# Patient Record
Sex: Male | Born: 1995 | Race: Black or African American | Hispanic: No | Marital: Single | Smoking: Never smoker
Health system: Southern US, Community
[De-identification: ages and names within clinical notes are randomized; demographics above are authoritative.]

## PROBLEM LIST (undated history)

## (undated) DIAGNOSIS — F419 Anxiety disorder, unspecified: Secondary | ICD-10-CM

## (undated) DIAGNOSIS — T7840XA Allergy, unspecified, initial encounter: Secondary | ICD-10-CM

## (undated) DIAGNOSIS — F32A Depression, unspecified: Secondary | ICD-10-CM

## (undated) DIAGNOSIS — J45909 Unspecified asthma, uncomplicated: Secondary | ICD-10-CM

## (undated) HISTORY — DX: Allergy, unspecified, initial encounter: T78.40XA

## (undated) HISTORY — DX: Depression, unspecified: F32.A

## (undated) HISTORY — DX: Unspecified asthma, uncomplicated: J45.909

## (undated) HISTORY — DX: Anxiety disorder, unspecified: F41.9

---
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
## 2014-10-22 ENCOUNTER — Encounter (INDEPENDENT_AMBULATORY_CARE_PROVIDER_SITE_OTHER): Payer: Self-pay

## 2014-10-22 ENCOUNTER — Ambulatory Visit (INDEPENDENT_AMBULATORY_CARE_PROVIDER_SITE_OTHER)
Admission: RE | Admit: 2014-10-22 | Discharge: 2014-10-22 | Disposition: A | Discharge status: Home / Self Care | Payer: BLUE CROSS/BLUE SHIELD | Source: Ambulatory Visit | Attending: Internal Medicine | Admitting: Internal Medicine

## 2014-10-22 ENCOUNTER — Ambulatory Visit (INDEPENDENT_AMBULATORY_CARE_PROVIDER_SITE_OTHER): Payer: BLUE CROSS/BLUE SHIELD | Admitting: Internal Medicine

## 2014-10-22 ENCOUNTER — Other Ambulatory Visit: Payer: BLUE CROSS/BLUE SHIELD

## 2014-10-22 ENCOUNTER — Encounter: Payer: Self-pay | Admitting: Internal Medicine

## 2014-10-22 VITALS — BP 128/78 | HR 95 | Ht 69.0 in | Wt 154.0 lb

## 2014-10-22 DIAGNOSIS — R0782 Intercostal pain: Secondary | ICD-10-CM | POA: Insufficient documentation

## 2014-10-22 DIAGNOSIS — R079 Chest pain, unspecified: Secondary | ICD-10-CM

## 2014-10-22 NOTE — Progress Notes (Signed)
Subjective:    Patient ID: Frederick Armstrong, male    DOB: Jun 02, 1996, 19 y.o.   MRN: [id number]  PCP No primary care provider on file.   HPI  IOV 10/22/2014  Chief Complaint  Patient presents with  . Pulmonary Consult    Pt c/o left chest pain randomly, last for 10 minutes. Pt stated when he inhales the pain increase. Pt denies SOB and cough.    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 Brought in by mom. He reports a history of chest pain in the left precordial area to the right of the left nipple present since October/November 2015. Pain developed during the off season when he was not exercising or training. It is persisted since then. Pain occurs few to several times a day. Happens randomly. Each episode lasts not longer than 10 minutes. It is very localized and focal. It feels like something is ballooning up there and is ready to pop. Deep inspiration makes the pain worse but other than this there is no aggravating or relieving factors. Is no associated radiation. At times the pain can be severe that he feels like he is dying. This no associated shortness of breath cough, wheezing, hemoptysis, night sweats, chills. He denies any local trauma.  Discussed substance abuse history with the patient he absolutely and categorically denies smoking cigarettes, marijuana, heroin, cocaine. Denies any unprotected sexual high risk behavior     has a past medical history of Childhood asthma.   reports that he has never smoked. He has never used smokeless tobacco.  No past surgical history on file.  No Known Allergies   There is no immunization history on file for this patient.  Family History  Problem Relation Age of Onset  . Heart disease Neg Hx   . Hyperlipidemia Neg Hx   . Cervical cancer Maternal Grandmother   . Hypertension Maternal Grandmother   . Hypertension Maternal Grandfather     No current outpatient prescriptions on file.     Review  of Systems  Constitutional: Negative for fever and unexpected weight change.  HENT: Negative for congestion, dental problem, ear pain, nosebleeds, postnasal drip, rhinorrhea, sinus pressure, sneezing, sore throat and trouble swallowing.   Eyes: Negative for redness and itching.  Respiratory: Negative for cough, chest tightness, shortness of breath and wheezing.   Cardiovascular: Positive for chest pain and leg swelling. Negative for palpitations.  Gastrointestinal: Negative for nausea and vomiting.  Genitourinary: Negative for dysuria.  Musculoskeletal: Negative for joint swelling.  Skin: Negative for rash.  Neurological: Negative for headaches.  Hematological: Does not bruise/bleed easily.  Psychiatric/Behavioral: Negative for dysphoric mood. The patient is not nervous/anxious.        Objective:   Physical Exam  Constitutional: He is oriented to person, place, and time. He appears well-developed and well-nourished. No distress.  HENT:  Head: Normocephalic and atraumatic.  Right Ear: External ear normal.  Left Ear: External ear normal.  Mouth/Throat: Oropharynx is clear and moist. No oropharyngeal exudate.  Eyes: Conjunctivae and EOM are normal. Pupils are equal, round, and reactive to light. Right eye exhibits no discharge. Left eye exhibits no discharge. No scleral icterus.  Neck: Normal range of motion. Neck supple. No JVD present. No tracheal deviation present. No thyromegaly present.  Cardiovascular: Normal rate, regular rhythm and intact distal pulses.  Exam reveals no gallop and no friction rub.   No murmur heard. Pulmonary/Chest: Effort normal and breath sounds normal. No respiratory distress. He has  no wheezes. He has no rales. He exhibits no tenderness.  No tenderness  Abdominal: Soft. Bowel sounds are normal. He exhibits no distension and no mass. There is no tenderness. There is no rebound and no guarding.  Musculoskeletal: Normal range of motion. He exhibits no edema or  tenderness.  Extremely muscular Chiseled body Hardly any body fat  Lymphadenopathy:    He has no cervical adenopathy.  Neurological: He is alert and oriented to person, place, and time. He has normal reflexes. No cranial nerve deficit. Coordination normal.  Skin: Skin is warm and dry. No rash noted. He is not diaphoretic. No erythema. No pallor.  Psychiatric: He has a normal mood and affect. His behavior is normal. Judgment and thought content normal.  Nursing note and vitals reviewed.   Filed Vitals:   10/22/14 1502  BP: 128/78  Pulse: 95  Height: 5\' 9"  (1.753 m)  Weight: 154 lb (69.854 kg)  SpO2: 100%         Assessment & Plan:     ICD-9-CM ICD-10-CM   1. Chest pain, unspecified chest pain type 786.50 R07.9 D-Dimer, Quantitative     DG Chest 2 View   I think he has musculoskeletal chest pain and may be some costochondritis with potential tendency to dislocate his left costochondral junction areas in the left lower ribs. Beyond this am not able to produce any other etiology. Given extreme concern by his mom and himself, we'll check d-dimer and chest x-ray. Based on this will consider pulmonary function tests, EKG, CT chest and follow-up   They both are agreeable to the plan   Dr. Kalman ShanMurali Suzanna Zahn, M.D., [facility] Pulmonary and Critical Care Medicine Staff Physician [facility] [place] [facility] Pager: [phone], If no answer or between  15:00h - 7:00h: call [phone]  10/22/2014 3:51 PM

## 2014-10-22 NOTE — Patient Instructions (Addendum)
ICD-9-CM ICD-10-CM   1. Chest pain, unspecified chest pain type 786.50 R07.9     - not sure what is causing your pain - suspect some friction between rib and rib joint - called costochondral junction - best rule out some serious diseases  Plan  - do d-dimer blood test - do CXR 2 view - depending on above results will recommend other tests like EKG, CT or PFT and folllowup plan

## 2014-10-23 ENCOUNTER — Telehealth: Payer: Self-pay | Admitting: Internal Medicine

## 2014-10-23 DIAGNOSIS — R7989 Other specified abnormal findings of blood chemistry: Secondary | ICD-10-CM

## 2014-10-23 LAB — D-DIMER, QUANTITATIVE: D-Dimer, Quant: 1.28 ug/mL-FEU — ABNORMAL HIGH (ref 0.00–0.48)

## 2014-10-23 NOTE — Telephone Encounter (Signed)
lmtcb for pt.  

## 2014-10-23 NOTE — Telephone Encounter (Signed)
LEt Donne AnonGahrul Mccuiston know that cxr is normal but d-dimer is high. Means there is a possibility for blood clost  NExt step  CT angio chest  - rule out PE - please order asap  Thanks  Dr. Kalman ShanMurali Tyse Auriemma, M.D., [facility] Pulmonary and Critical Care Medicine Staff Physician [facility] [place] [facility] Pager: [phone], If no answer or between  15:00h - 7:00h: call [phone]  10/23/2014 6:31 AM

## 2014-10-23 NOTE — Telephone Encounter (Signed)
lmtcb for pt and EC, [name].

## 2014-10-23 NOTE — Progress Notes (Signed)
Quick Note:  lmtcb for pt x 2. Will sign off as results are within a phone note being followed. ______

## 2014-10-24 ENCOUNTER — Ambulatory Visit (INDEPENDENT_AMBULATORY_CARE_PROVIDER_SITE_OTHER)
Admission: RE | Admit: 2014-10-24 | Discharge: 2014-10-24 | Disposition: A | Discharge status: Home / Self Care | Payer: BLUE CROSS/BLUE SHIELD | Source: Ambulatory Visit | Attending: Internal Medicine | Admitting: Internal Medicine

## 2014-10-24 DIAGNOSIS — R791 Abnormal coagulation profile: Secondary | ICD-10-CM

## 2014-10-24 DIAGNOSIS — R7989 Other specified abnormal findings of blood chemistry: Secondary | ICD-10-CM

## 2014-10-24 MED ORDER — IOHEXOL 350 MG/ML SOLN
80.0000 mL | Freq: Once | INTRAVENOUS | Status: AC | PRN
  Administered 2014-10-24: 80 mL via INTRAVENOUS

## 2014-10-24 NOTE — Telephone Encounter (Signed)
Pt aware of results. Aware order has been placed for CT angio chest for elevated d dimer and that our PCC's will call him shortly with date,time, and location for test. Nothing more needed at this time.

## 2014-10-24 NOTE — Telephone Encounter (Signed)
Called and spoke to pt. Informed pt of the CTA time and date. Pt verbalized understanding and denied any further questions or concerns at this time.    Note   [facility] at 10:15 arrival time. NPO 2 hours before. Checking on pre cert. Rhonda J Cobb LMOAM for pt to return my call ASAP. Rhonda J Cobb

## 2014-10-24 NOTE — Telephone Encounter (Signed)
Pt returning a call [phone]

## 2014-10-25 ENCOUNTER — Telehealth: Payer: Self-pay | Admitting: Internal Medicine

## 2014-10-25 DIAGNOSIS — R0782 Intercostal pain: Secondary | ICD-10-CM

## 2014-10-25 DIAGNOSIS — R079 Chest pain, unspecified: Secondary | ICD-10-CM

## 2014-10-25 NOTE — Telephone Encounter (Signed)
Spoke with pt mother, [name], aware of results.  Order placed for PFT Scheduled OV per MR with TP 11/01/14 at 2:30 - requested that results be sent with patient appt  [facility] ( arrive 15 mins early) No caffeine, no inhalers and no smoking 4 hours prior.   Nothing further needed.

## 2014-10-25 NOTE — Telephone Encounter (Signed)
Let Donne AnonGahrul Bottger know that CT chest is normal. Only thing is arm pit glads are a bit big and likely normal but needs to talk to PCP about it  Cause for pain is not known   - have him do ful PFT and return for followup with Tammy Parrett/me  - also note for myself/Tammy when he comes for fu: we will do urine tox screen (this is not an instruction to patient today)  Dr. Kalman ShanMurali Robey Massmann, M.D., [facility] Pulmonary and Critical Care Medicine Staff Physician [facility] [place] [facility] Pager: [phone], If no answer or between  15:00h - 7:00h: call [phone]  10/25/2014 10:14 AM

## 2014-11-01 ENCOUNTER — Ambulatory Visit (HOSPITAL_COMMUNITY)
Admission: RE | Admit: 2014-11-01 | Discharge: 2014-11-01 | Disposition: A | Discharge status: Home / Self Care | Payer: BLUE CROSS/BLUE SHIELD | Source: Ambulatory Visit | Attending: Internal Medicine | Admitting: Internal Medicine

## 2014-11-01 ENCOUNTER — Ambulatory Visit (INDEPENDENT_AMBULATORY_CARE_PROVIDER_SITE_OTHER): Payer: BLUE CROSS/BLUE SHIELD | Admitting: Adult Health

## 2014-11-01 ENCOUNTER — Encounter: Payer: Self-pay | Admitting: Adult Health

## 2014-11-01 VITALS — BP 124/66 | HR 79 | Temp 97.3°F | Ht 69.0 in | Wt 154.6 lb

## 2014-11-01 DIAGNOSIS — R079 Chest pain, unspecified: Secondary | ICD-10-CM

## 2014-11-01 DIAGNOSIS — R0782 Intercostal pain: Secondary | ICD-10-CM | POA: Diagnosis not present

## 2014-11-01 LAB — PULMONARY FUNCTION TEST
FEF 25-75 POST: 3.45 L/s
FEF 25-75 PRE: 4.59 L/s
FEF2575-%Change-Post: -24 %
FEF2575-%PRED-PRE: 104 %
FEF2575-%Pred-Post: 78 %
FEV1-%Change-Post: -8 %
FEV1-%PRED-PRE: 109 %
FEV1-%Pred-Post: 100 %
FEV1-PRE: 4.13 L
FEV1-Post: 3.78 L
FEV1FVC-%Change-Post: 0 %
FEV1FVC-%Pred-Pre: 97 %
FEV6-%CHANGE-POST: -8 %
FEV6-%PRED-POST: 102 %
FEV6-%Pred-Pre: 112 %
FEV6-POST: 4.5 L
FEV6-PRE: 4.91 L
FEV6FVC-%PRED-POST: 101 %
FEV6FVC-%PRED-PRE: 101 %
FVC-%Change-Post: -8 %
FVC-%PRED-POST: 102 %
FVC-%PRED-PRE: 111 %
FVC-POST: 4.5 L
FVC-Pre: 4.91 L
POST FEV1/FVC RATIO: 84 %
POST FEV6/FVC RATIO: 100 %
Pre FEV1/FVC ratio: 84 %
Pre FEV6/FVC Ratio: 100 %
RV % PRED: 120 %
RV: 1.56 L
TLC % pred: 96 %
TLC: 6.23 L

## 2014-11-01 MED ORDER — ALBUTEROL SULFATE (2.5 MG/3ML) 0.083% IN NEBU
2.5000 mg | INHALATION_SOLUTION | Freq: Once | RESPIRATORY_TRACT | Status: AC
  Administered 2014-11-01: 2.5 mg via RESPIRATORY_TRACT

## 2014-11-01 NOTE — Patient Instructions (Signed)
Your breathing test is normal. May try some Gas-X with meals. Prilosec daily as needed for heartburn Would refrain from heavy bench presses We will refer you to cardiology for evaluation of chest pain Follow up with pulmonary on as-needed basis

## 2014-11-01 NOTE — Progress Notes (Signed)
Subjective:    Patient ID: Frederick Armstrong, male    DOB: 18-Jul-1996, 19 y.o.   MRN: [id number]  PCP No primary care provider on file.   HPI  IOV 10/22/2014  Chief Complaint  Patient presents with  . Pulmonary Consult    Pt c/o left chest pain randomly, last for 10 minutes. Pt stated when he inhales the pain increase. Pt denies SOB and cough.    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 Brought in by mom. He reports a history of chest pain in the left precordial area to the right of the left nipple present since October/November 2015. Pain developed during the off season when he was not exercising or training. It is persisted since then. Pain occurs few to several times a day. Happens randomly. Each episode lasts not longer than 10 minutes. It is very localized and focal. It feels like something is ballooning up there and is ready to pop. Deep inspiration makes the pain worse but other than this there is no aggravating or relieving factors. Is no associated radiation. At times the pain can be severe that he feels like he is dying. This no associated shortness of breath cough, wheezing, hemoptysis, night sweats, chills. He denies any local trauma.  Discussed substance abuse history with the patient he absolutely and categorically denies smoking cigarettes, marijuana, heroin, cocaine. Denies any unprotected sexual high risk behavior  11/01/2014 Follow up  Patient returns for a two-week follow-up. Was seen for a pulmonary consultation. Last visit for chest pain that began in Oct 2015 .  Describes as pain along mid chest wall, some tenderness.  Worse with inspiration. Mainly at rest. 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  d-dimer was elevated, he subsequently underwent a CT chest that showed no evidence of pulmonary embolism. Lungs were clear.  PFTs showed FEV1 at 109%, ratio 84, no significant bronchodilator response, FVC 111%, diffusing capacity 121% He  denies any exertional chest pain, palpitations, presyncope, syncope, dizziness, headaches, nausea, vomiting, heartburn. Negative family history for heart disease   Review of Systems  Constitutional:   No  weight loss, night sweats,  Fevers, chills, fatigue, or  lassitude.  HEENT:   No headaches,  Difficulty swallowing,  Tooth/dental problems, or  Sore throat,                No sneezing, itching, ear ache, nasal congestion, post nasal drip,   CV:  No  ,  Orthopnea, PND, swelling in lower extremities, anasarca, dizziness, palpitations, syncope.   GI  No heartburn, indigestion, abdominal pain, nausea, vomiting, diarrhea, change in bowel habits, loss of appetite, bloody stools.   Resp: No shortness of breath with exertion or at rest.  No excess mucus, no productive cough,  No non-productive cough,  No coughing up of blood.  No change in color of mucus.  No wheezing.  No chest wall deformity  Skin: no rash or lesions.  GU: no dysuria, change in color of urine, no urgency or frequency.  No flank pain, no hematuria   MS:  No joint pain or swelling.  No decreased range of motion.  No back pain.  Psych:  No change in mood or affect. No depression or anxiety.  No memory loss.         Objective:   Physical Exam . GEN: A/Ox3; pleasant , NAD, well nourished   HEENT:  [place]/AT,  EACs-clear, TMs-wnl, NOSE-clear, THROAT-clear, no lesions, no postnasal drip  or exudate noted.   NECK:  Supple w/ fair ROM; no JVD; normal carotid impulses w/o bruits; no thyromegaly or nodules palpated; no lymphadenopathy.  RESP  Clear  P & A; w/o, wheezes/ rales/ or rhonchi.no accessory muscle use, no dullness to percussion  CARD:  RRR, no m/r/g  , no peripheral edema, pulses intact, no cyanosis or clubbing.  GI:   Soft & nt; nml bowel sounds; no organomegaly or masses detected.  Musco: Warm bil, no deformities or joint swelling noted.   Neuro: alert, no focal deficits noted.    Skin: Warm, no lesions or  rashes      Assessment & Plan:

## 2014-11-01 NOTE — Assessment & Plan Note (Addendum)
4 month history of mid chest wall pain , questionable etiology Pulmonary Workup has been unrevealing CT chest was negative for pulmonary embolism and lungs were clear. PFT today shows no airflow obstruction or restriction. O2 saturation is 100%. Feel this is likely musculoskeletal in nature. 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

## 2014-11-21 ENCOUNTER — Encounter: Payer: Self-pay | Admitting: Cardiovascular Disease

## 2014-11-21 ENCOUNTER — Ambulatory Visit (INDEPENDENT_AMBULATORY_CARE_PROVIDER_SITE_OTHER): Payer: BLUE CROSS/BLUE SHIELD | Admitting: Cardiovascular Disease

## 2014-11-21 VITALS — BP 105/74 | HR 71 | Ht 69.0 in | Wt 151.0 lb

## 2014-11-21 DIAGNOSIS — R079 Chest pain, unspecified: Secondary | ICD-10-CM

## 2014-11-21 LAB — SEDIMENTATION RATE: SED RATE: 3 mm/h (ref 0–22)

## 2014-11-21 LAB — C-REACTIVE PROTEIN: CRP: <0.1 mg/dL — ABNORMAL LOW (ref 0.5–20.0)

## 2014-11-21 NOTE — Assessment & Plan Note (Signed)
Very atypical Really sounds like cartilage pain  Normal exam , no high risk family history .  Normal coronary origins on CT done for PE Pain is primarily non exertional  Will get echo to r/o HOCM or other structural heart disease.  Unfortunately left without ECG being done and has none in system so will have it when he gets his echo

## 2014-11-21 NOTE — Progress Notes (Signed)
Patient ID: Frederick Armstrong, male   DOB: [birth year], 19 y.o.   MRN: [id number]     Cardiology Office Note   Date:  11/21/2014   ID:  Frederick Armstrong, DOB 11-03-1995, MRN [id number]  PCP:  No PCP Per Patient  Cardiologist:   Charlton Haws, MD   No chief complaint on file.     History of Present Illness: Frederick Armstrong is a 19 y.o. male who presents for atypical chest pain.  .Was seen for a pulmonary consultation. Last visit for chest pain that began in Oct 2015 . Describes as pain along mid chest wall, some tenderness. Worse with inspiration. Mainly at rest. 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 d-dimer was elevated, he subsequently underwent a CT chest that showed no evidence of pulmonary embolism.Lungs were clear. PFTs showed FEV1 at 109%, ratio 84, no significant bronchodilator response, FVC 111%, diffusing capacity 121%He denies any exertional chest pain, palpitations, presyncope, syncope, dizziness, headaches, nausea, vomiting, heartburn.  Negative family history for heart disease   Review of CT 2.3.16 also showed normal aorta and normal origin of the coronary arteries    Past Medical History  Diagnosis Date  . Childhood asthma     No past surgical history on file.   No current outpatient prescriptions on file.   No current facility-administered medications for this visit.    Allergies:   Review of patient's allergies indicates no known allergies.    Social History:  The patient  reports that he has never smoked. He has never used smokeless tobacco. He reports that he does not drink alcohol or use illicit drugs.   Family History:  The patient's family history includes Cervical cancer in his maternal grandmother; Hypertension in his maternal grandfather and maternal grandmother. There is no history of Heart disease or Hyperlipidemia.    ROS:  Please see the history of present illness.   Otherwise, review of systems are positive for none.   All other systems are reviewed and negative.     PHYSICAL EXAM: VS:  There were no vitals taken for this visit. , BMI There is no weight on file to calculate BMI. GEN: Well nourished, well developed, in no acute distress HEENT: normal Neck: no JVD, carotid bruits, or masses Cardiac:  RRR; no murmurs, rubs, or gallops,no edema  Respiratory:  clear to auscultation bilaterally, normal work of breathing GI: soft, nontender, nondistended, + BS MS: no deformity or atrophy Skin: warm and dry, no rash Neuro:  Strength and sensation are intact Psych: euthymic mood, full affect   EKG:  To be done   Recent Labs: No results found for requested labs within last 365 days.    Lipid Panel No results found for: CHOL, TRIG, HDL, CHOLHDL, VLDL, LDLCALC, LDLDIRECT    Wt Readings from Last 3 Encounters:  11/01/14 70.126 kg (154 lb 9.6 oz) (50 %*, Z = -0.01)  10/22/14 69.854 kg (154 lb) (49 %*, Z = -0.03)  10/25/12 65.318 kg (144 lb) (46 %*, Z = -0.11)   * Growth percentiles are based on CDC 2-20 Years data.      Other studies Reviewed: Additional studies/ records that were reviewed today include: Pulmonary records.    ASSESSMENT AND PLAN:  1.  Chest Pain:  Atypical non exertional normal exam  Normal coronary origins CT  F/u echo and ECG     Current medicines are reviewed at length with the patient today.  The patient does not have concerns regarding medicines.  The  following changes have been made:  no change  Labs/ tests ordered today include:  ECG and Echo  No orders of the defined types were placed in this encounter.     Disposition:   FU with PRN  i    Signed, Charlton HawsPeter Ulmer Degen, MD  11/21/2014 2:09 PM    [facility] [address], [place], [place]  [address] Phone: [phone]; Fax: [phone]

## 2014-11-21 NOTE — Patient Instructions (Addendum)
Your physician recommends that you continue on your current medications as directed. Please refer to the Current Medication list given to you today.  Your physician has requested that you have an echocardiogram. Echocardiography is a painless test that uses sound waves to create images of your heart. It provides your doctor with information about the size and shape of your heart and how well your heart's chambers and valves are working. This procedure takes approximately one hour. There are no restrictions for this procedure.   Your physician recommends that you return for lab work in: today (Sed rate, ESR and CRP)  Your physician recommends that you schedule a follow-up appointment in: as needed

## 2014-11-26 ENCOUNTER — Ambulatory Visit (HOSPITAL_COMMUNITY): Payer: BLUE CROSS/BLUE SHIELD | Attending: Internal Medicine

## 2014-11-26 DIAGNOSIS — R079 Chest pain, unspecified: Secondary | ICD-10-CM | POA: Insufficient documentation

## 2014-11-26 NOTE — Progress Notes (Signed)
2D Echo completed. 11/26/2014 

## 2015-06-25 IMAGING — CT CT ANGIO CHEST
2 of 6 series · 19 of 36 positions shown · IV contrast (Omnipaque 300)
Comparison: Radiographs 10/22/2014 and 06/14/2006.

CLINICAL DATA: Intermittent left-sided chest pain for 3 months.
Elevated D-dimer levels. History of childhood asthma. Evaluate for
pulmonary embolism. Initial encounter.

EXAM:
CT ANGIOGRAPHY CHEST WITH CONTRAST
TECHNIQUE: Multidetector CT imaging of the chest was performed using the
standard protocol during bolus administration of intravenous
contrast. Multiplanar CT image reconstructions and MIPs were
obtained to evaluate the vascular anatomy.
CONTRAST:  80mL OMNIPAQUE IOHEXOL 350 MG/ML SOLN

[Series 6: thins (id) / (id) · axial · 0.62mm/px · z∈[-238,+7]mm · 18 of 273 slices shown]
[im 14/273  lung]
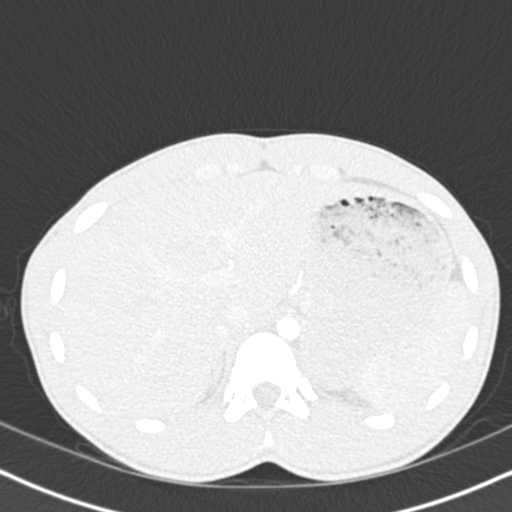
[im 28/273  mediastinal]
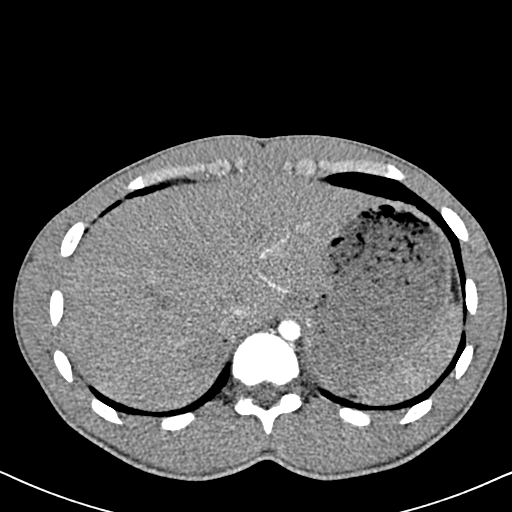
[im 41/273  lung]
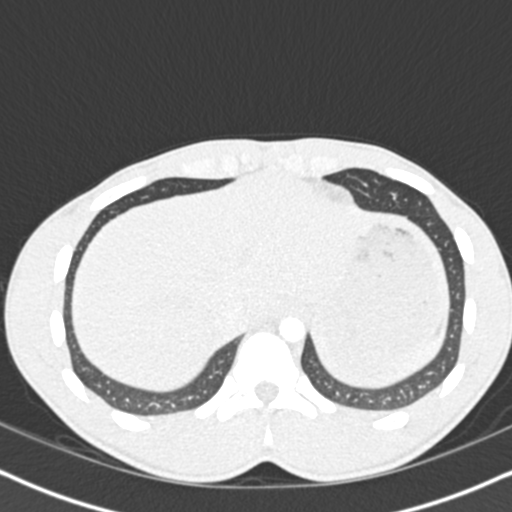
[im 55/273  mediastinal]
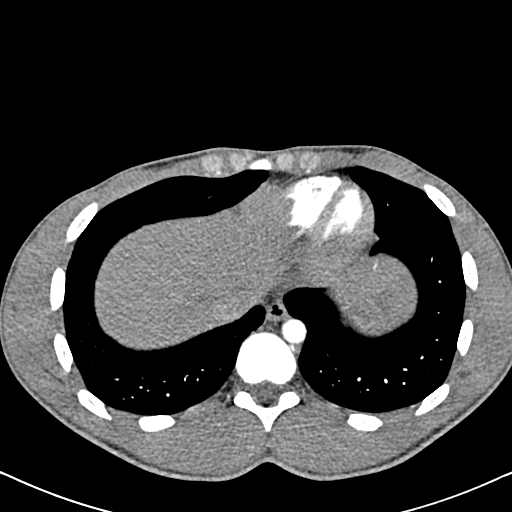
[im 69/273  lung]
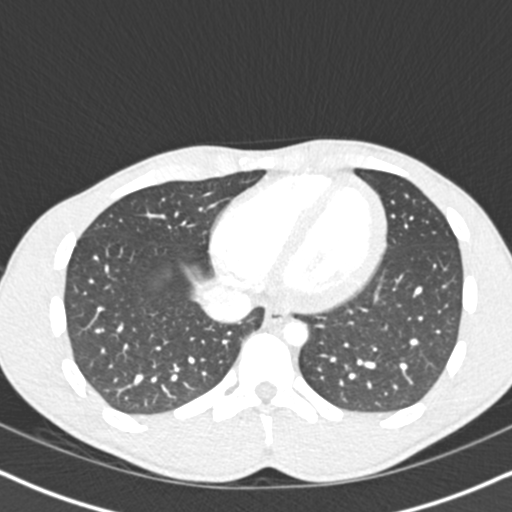
[im 82/273  mediastinal]
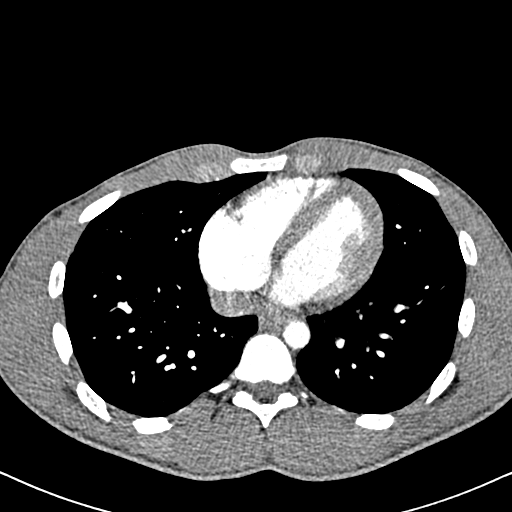
[im 96/273  lung]
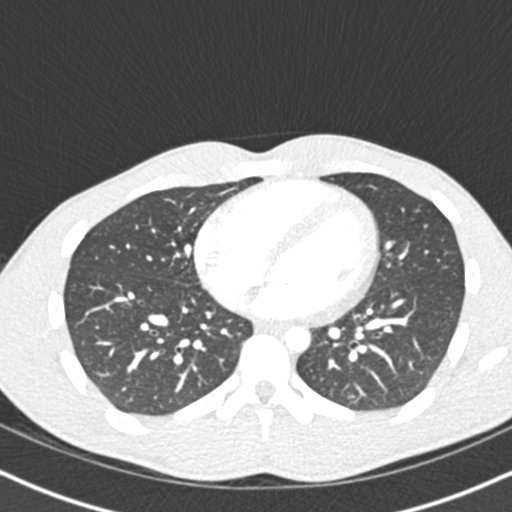
[im 109/273  mediastinal]
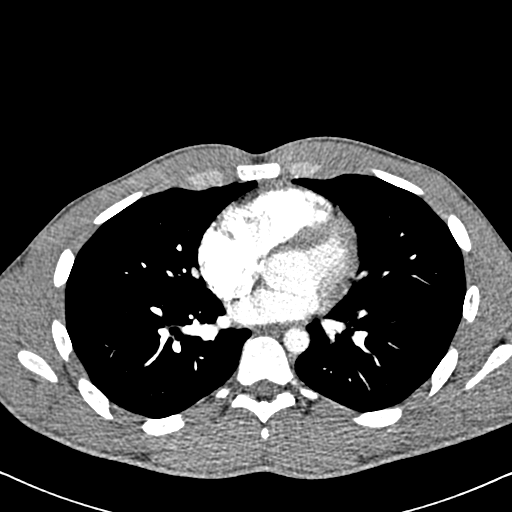
[im 123/273  lung]
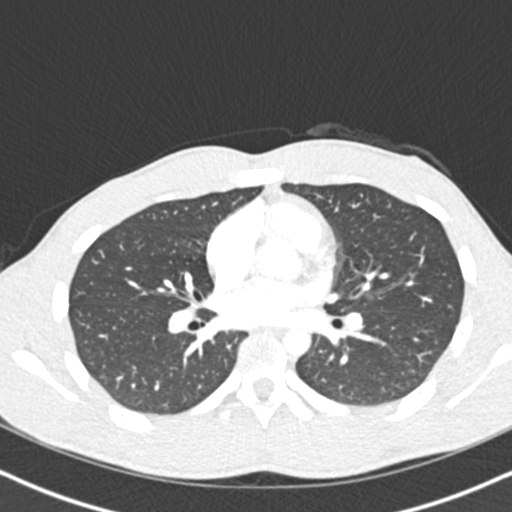
[im 150/273  mediastinal]
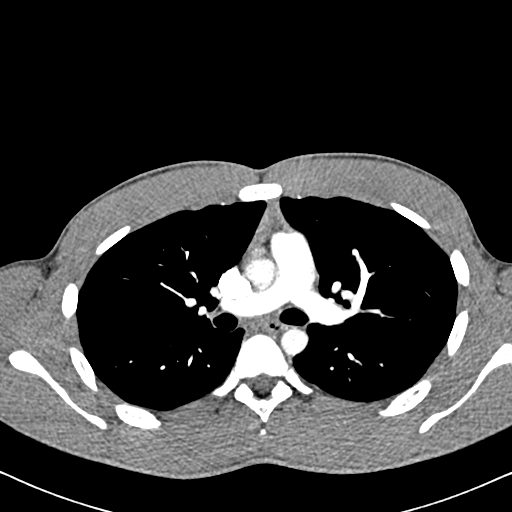
[im 164/273  lung]
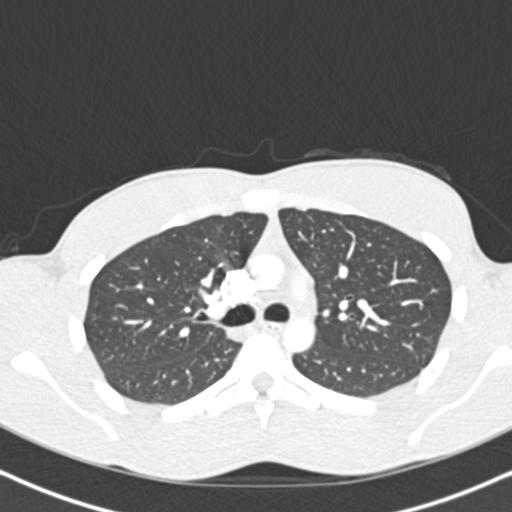
[im 177/273  mediastinal]
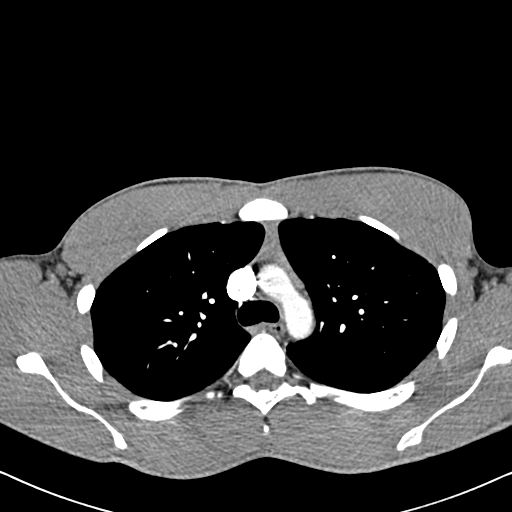
[im 191/273  lung]
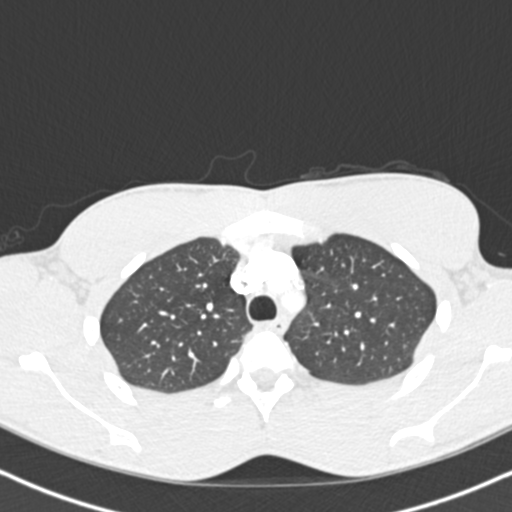
[im 205/273  mediastinal]
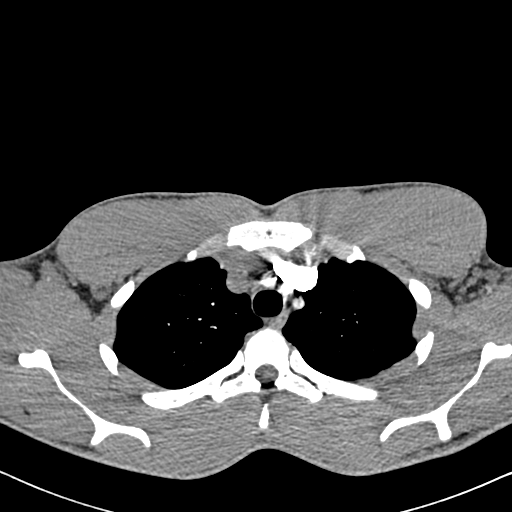
[im 218/273  lung]
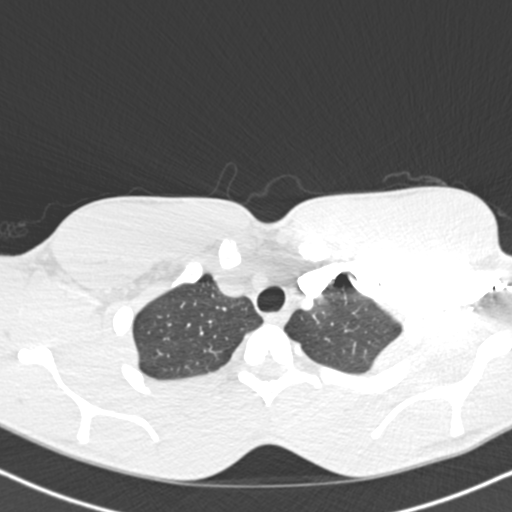
[im 232/273  mediastinal]
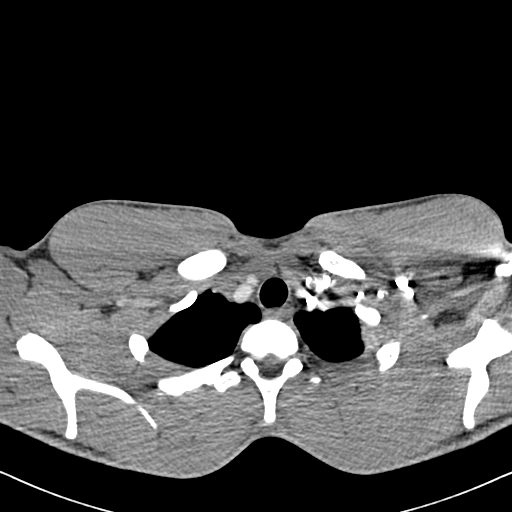
[im 245/273  lung]
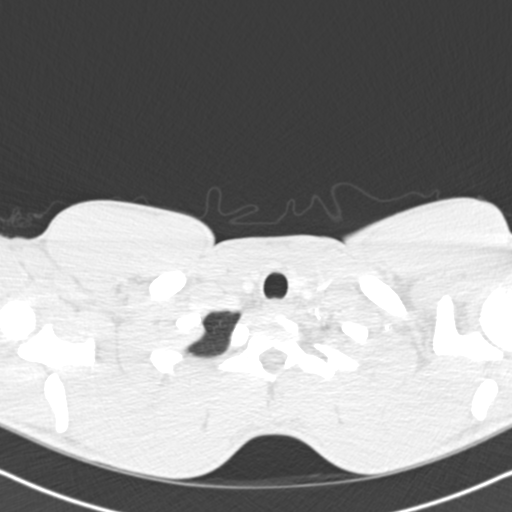
[im 259/273  mediastinal]
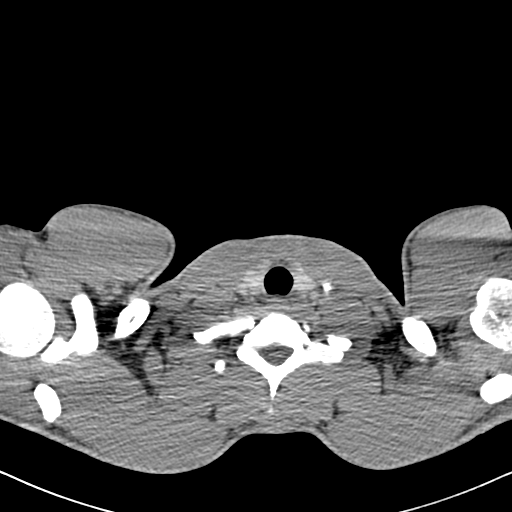

[Series 602: cor mpr · coronal · 0.62mm/px · 1 of 88 slices shown]
[im 44/88  mediastinal]
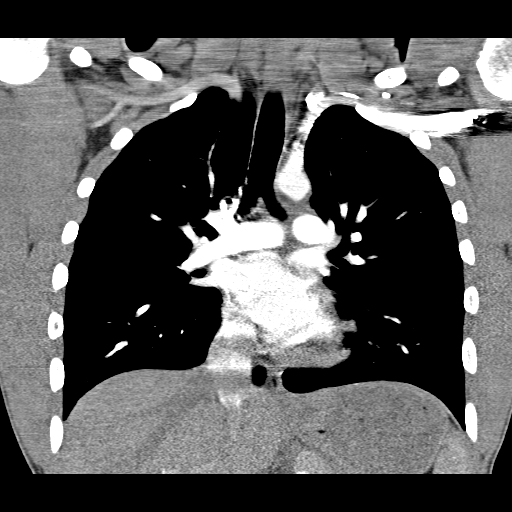

[19 of 36 positions shown; findings below may reference images not displayed]

FINDINGS: Vascular: The pulmonary arteries are well opacified with contrast.
There is no evidence of acute pulmonary embolism. The systemic
vasculature appears normal.

Mediastinum: There are no enlarged mediastinal or hilar lymph nodes.
There are mildly prominent axillary lymph nodes bilaterally which
are probably reactive. The thyroid gland, trachea and esophagus
demonstrate no significant findings. Residual thymic tissue is noted
within the anterior mediastinum.

Lungs/Pleura: There is no pleural or pericardial effusion.The lungs
are clear.

Upper abdomen: Unremarkable.  There is no adrenal mass.

Musculoskeletal/Chest wall: No chest wall lesion or acute osseous
findings.

Review of the MIP images confirms the above findings.
IMPRESSION: 1. No evidence of acute pulmonary embolism or other acute chest
process.
2. Prominent axillary lymph nodes bilaterally, likely reactive. No
evidence of mediastinal adenopathy.

## 2017-06-26 ENCOUNTER — Emergency Department (HOSPITAL_COMMUNITY)
Admission: EM | Admit: 2017-06-26 | Discharge: 2017-06-26 | Disposition: A | Discharge status: Home / Self Care | Payer: Worker's Compensation | Attending: Emergency Medicine | Admitting: Emergency Medicine

## 2017-06-26 ENCOUNTER — Encounter (HOSPITAL_COMMUNITY): Payer: Self-pay | Admitting: *Deleted

## 2017-06-26 DIAGNOSIS — Y929 Unspecified place or not applicable: Secondary | ICD-10-CM | POA: Insufficient documentation

## 2017-06-26 DIAGNOSIS — S71111A Laceration without foreign body, right thigh, initial encounter: Secondary | ICD-10-CM | POA: Insufficient documentation

## 2017-06-26 DIAGNOSIS — Z8709 Personal history of other diseases of the respiratory system: Secondary | ICD-10-CM | POA: Diagnosis not present

## 2017-06-26 DIAGNOSIS — Z23 Encounter for immunization: Secondary | ICD-10-CM | POA: Diagnosis not present

## 2017-06-26 DIAGNOSIS — S81811A Laceration without foreign body, right lower leg, initial encounter: Secondary | ICD-10-CM

## 2017-06-26 DIAGNOSIS — Y939 Activity, unspecified: Secondary | ICD-10-CM | POA: Insufficient documentation

## 2017-06-26 DIAGNOSIS — Y990 Civilian activity done for income or pay: Secondary | ICD-10-CM | POA: Diagnosis not present

## 2017-06-26 DIAGNOSIS — W260XXA Contact with knife, initial encounter: Secondary | ICD-10-CM | POA: Diagnosis not present

## 2017-06-26 MED ORDER — TETANUS-DIPHTH-ACELL PERTUSSIS 5-2.5-18.5 LF-MCG/0.5 IM SUSP
0.5000 mL | Freq: Once | INTRAMUSCULAR | Status: AC
  Administered 2017-06-26: 0.5 mL via INTRAMUSCULAR
  Filled 2017-06-26: qty 0.5

## 2017-06-26 NOTE — ED Provider Notes (Signed)
[facility]-EMERGENCY DEPT Provider Note   CSN: [id number] Arrival date & time: 06/26/17  2109     History   Chief Complaint Chief Complaint  Patient presents with  . Laceration    HPI Frederick Armstrong is a 21 y.o. male who presents today for evaluation of a laceration that occurred at work on Tuesday night. He does not know when his last tetanus shot was. He denies any fevers or chills, there is no pus draining from the wound. He has been keeping it bandaged and clean.  He denies any pain.  He denies any redness or streaking around the wound.   HPI  Past Medical History:  Diagnosis Date  . Childhood asthma     Patient Active Problem List   Diagnosis Date Noted  . Intercostal pain 10/22/2014  . Pain in the chest 10/22/2014    History reviewed. No pertinent surgical history.     Home Medications    Prior to Admission medications   Not on File    Family History Family History  Problem Relation Age of Onset  . Cervical cancer Maternal Grandmother   . Hypertension Maternal Grandmother   . Hypertension Maternal Grandfather   . Heart disease Neg Hx   . Hyperlipidemia Neg Hx     Social History Social History  Substance Use Topics  . Smoking status: Never Smoker  . Smokeless tobacco: Never Used  . Alcohol use No     Allergies   Patient has no known allergies.   Review of Systems Review of Systems  Constitutional: Negative for chills and fever.  Skin: Positive for wound. Negative for color change and rash.  Neurological: Negative for weakness.     Physical Exam Updated Vital Signs BP 130/80 (BP Location: Left Arm)   Pulse 61   Temp 98 F (36.7 C) (Oral)   Resp 14   SpO2 100%   Physical Exam  Constitutional: He appears well-developed and well-nourished.  HENT:  Head: Normocephalic and atraumatic.  Cardiovascular: Intact distal pulses.   Musculoskeletal:  No pain with movement of right leg.   Neurological: He is alert. No sensory deficit.  Skin: Skin  is warm and dry. He is not diaphoretic.  Wound is present to right lateral thigh, partially through dermis. There is no obvious surrounding abnormal erythema, edema, fluctuance, or induration. There is no drainage from the wound.  Wound bed is clean.   Psychiatric: He has a normal mood and affect. His behavior is normal.  Nursing note and vitals reviewed.      ED Treatments / Results  Labs (all labs ordered are listed, but only abnormal results are displayed) Labs Reviewed - No data to display  EKG  EKG Interpretation None       Radiology No results found.  Procedures Procedures (including critical care time)  Medications Ordered in ED Medications  Tdap (BOOSTRIX) injection 0.5 mL (0.5 mLs Intramuscular Given 06/26/17 2319)     Initial Impression / Assessment and Plan / ED Course  I have reviewed the triage vital signs and the nursing notes.  Pertinent labs & imaging results that were available during my care of the patient were reviewed by me and considered in my medical decision making (see chart for details).    Frederick Armstrong presents today for evaluation of a laceration that occurred at work approximately 4 days ago.  Wound is not obviously infected, wound bed appears clean, without purulent or abnormal drainage. Wound is through the dermis partially, no obvious  subcutaneous tissue showing.  Patient's tetanus was updated. There is no bleeding. Wound was not sutured, due to the injury occurring approximately 4 days ago.  Oral antibiotics were considered, however at this point the wound does not appear infected, and patient appears to be adequately cleaning, and dressing his wound at home. Patient was given instructions on cleaning the wound, dressing at, and antibiotic ointment use. He was given return precautions, and educated on potential signs of infection. He was given the option to ask questions, all of which were answered to the best of my abilities.    Final Clinical  Impressions(s) / ED Diagnoses   Final diagnoses:  Laceration of right lower extremity, initial encounter    New Prescriptions There are no discharge medications for this patient.    Frederick Armstrong 06/26/17 2327    Marily Memos, MD 06/26/17 [phone]

## 2017-06-26 NOTE — Discharge Instructions (Signed)

## 2017-06-26 NOTE — ED Notes (Signed)
Patient is alert and orientedx4.  Patient was explained discharge instructions and they understood them with no questions.   

## 2017-06-26 NOTE — ED Triage Notes (Signed)
The pt was cut with a knife at work Tuesday night !" laceration that is open but not bleeding no redness or swelling and no pain

## 2019-12-15 ENCOUNTER — Ambulatory Visit (HOSPITAL_COMMUNITY)
Admission: AD | Admit: 2019-12-15 | Discharge: 2019-12-15 | Disposition: A | Discharge status: Home / Self Care | Payer: 59 | Attending: Psychiatry | Admitting: Psychiatry

## 2019-12-15 DIAGNOSIS — R45851 Suicidal ideations: Secondary | ICD-10-CM | POA: Diagnosis not present

## 2019-12-15 DIAGNOSIS — F333 Major depressive disorder, recurrent, severe with psychotic symptoms: Secondary | ICD-10-CM | POA: Insufficient documentation

## 2019-12-15 DIAGNOSIS — F419 Anxiety disorder, unspecified: Secondary | ICD-10-CM | POA: Insufficient documentation

## 2019-12-15 NOTE — BH Assessment (Signed)
Assessment Note  Frederick Armstrong is a single 24 y.o. male who presents voluntarily to [facility] for a walk-in assessment. Pt was accompanied by his grandparents who waited in the lobby/car. Pt is reporting symptoms of depression with suicidal ideation. Pt has no history of psychiatric dx or tx. His mother wanted pt to come for assessment. Pt reports current suicidal ideation earlier this day with plans of an intentional car accident. Past attempts include none. Pt acknowledges multiple symptoms of Depression, including anhedonia, isolating, feelings of worthlessness, & changes in sleep. Pt denies homicidal ideation/ history of violence. Pt denies auditory & visual hallucinations & other symptoms of psychosis. Pt states current stressors include a recent breakup with a girl he was seeing for 11 months. He is also stressed by still having to complete 30 of required 100 hours of community service requirement by 12/28/19 (for a possession charge in 2019).  Pt lives with his sister, and supports include mother and good friend. Pt denies hx of abuse. He was removed from his mother's care by CPS for a few years but knows little of the circumstances. Pt stayed with his paternal grandparents, younger sister & brother went to foster care. Pt's brother did not return home. Pt denies family history of substance abuse and metal illness. Pt's work history includes Tree surgeon x 3 years. Pt has partial insight and judgment. Pt's memory is intact. Legal history includes a possession charge in 2019.  Protective factors against suicide include good family support, no access to firearms, no current psychotic symptoms and no prior attempts.?  Pt and no hx of inpt or outpt tx Pt denies alcohol abuse. He reports twice daily THC use. ? MSE: Pt is casually dressed, alert, oriented x4 with normal speech and normal motor behavior. Eye contact is good. Pt's mood is depressed and affect is depressed and constricted. Affect is congruent  with mood. Thought process is coherent and relevant. There is no indication pt is currently responding to internal stimuli or experiencing delusional thought content. Pt was cooperative throughout assessment.   Disposition:  Malachy Chamber, NP recommended outpt psychiatric tx. Pt is interested in IOP- brochure given and referral made to [facility].  Diagnosis: MDD, single, severe without sx of psychosis  Past Medical History:  Past Medical History:  Diagnosis Date  . Childhood asthma     No past surgical history on file.  Family History:  Family History  Problem Relation Age of Onset  . Cervical cancer Maternal Grandmother   . Hypertension Maternal Grandmother   . Hypertension Maternal Grandfather   . Heart disease Neg Hx   . Hyperlipidemia Neg Hx     Social History:  reports that he has never smoked. He has never used smokeless tobacco. He reports that he does not drink alcohol or use drugs.  Additional Social History:  Alcohol / Drug Use Pain Medications: None reported Prescriptions: None reported Over the Counter: None reported History of alcohol / drug use?: Yes Substance #1 Name of Substance 1: thc 1 - Frequency: twice daily 1 - Duration: ongoing 1 - Last Use / Amount: 12/14/19  CIWA: CIWA-Ar BP: 122/67 Pulse Rate: 78 COWS:    Allergies: No Known Allergies  Home Medications: (Not in a hospital admission)   OB/GYN Status:  No LMP for male patient.  General Assessment Data Location of Assessment: [facility] TTS Assessment: In system Is this a Tele or Face-to-Face Assessment?: Face-to-Face Is this an Initial Assessment or a Re-assessment for this  encounter?: Initial Assessment Patient Accompanied by:: (grandparents in lobby) Language Other than English: No Living Arrangements: Other (Comment) What gender do you identify as?: Male Marital status: Single Living Arrangements: Other relatives(sister) Can pt return to current living arrangement?:  Yes Admission Status: Voluntary Is patient capable of signing voluntary admission?: Yes Referral Source: Self/Family/Friend [facility] type[facility]     Crisis Care Plan Living Arrangements: Other relatives(sister) Name of Psychiatrist: none Name of Therapist: none  Education Status Is patient currently in school?: No Is the patient employed, unemployed or receiving disability?: Employed([employer] x 3 years)  Risk to self with the past 6 months Suicidal Ideation: Yes-Currently Present(earlier today) Has patient been a risk to self within the past 6 months prior to admission? : No Suicidal Intent: No Has patient had any suicidal intent within the past 6 months prior to admission? : No Is patient at risk for suicide?: Yes Suicidal Plan?: Yes-Currently Present Has patient had any suicidal plan within the past 6 months prior to admission? : Yes Specify Current Suicidal Plan: intentional car accident Access to Means: Yes What has been your use of drugs/alcohol within the last 12 months?: thc 2x daily Previous Attempts/Gestures: No How many times?: 0 Other Self Harm Risks: recent MH tx, depression, SI, loss of relationship Intentional Self Injurious Behavior: None Family Suicide History: Unknown Recent stressful life event(s): Loss (Comment)(loss of relationship of 11 mths) Persecutory voices/beliefs?: No Depression: Yes Depression Symptoms: Despondent, Insomnia, Tearfulness, Isolating, Fatigue, Loss of interest in usual pleasures, Feeling worthless/self pity Substance abuse history and/or treatment for substance abuse?: Yes Suicide prevention information given to non-admitted patients: Yes  Risk to Others within the past 6 months Homicidal Ideation: No Does patient have any lifetime risk of violence toward others beyond the six months prior to admission? : No Thoughts of Harm to Others: No Current Homicidal Intent: No Current Homicidal Plan: No Access to Homicidal Means:  No History of harm to others?: No Assessment of Violence: None Noted Does patient have access to weapons?: No Criminal Charges Pending?: No Does patient have a court date: No Is patient on probation?: Yes(has to finish community service by 12/28/2019)  Psychosis Hallucinations: None noted Delusions: None noted  Mental Status Report Appearance/Hygiene: Unremarkable Eye Contact: Good Motor Activity: Freedom of movement Speech: Logical/coherent Level of Consciousness: Alert Mood: Pleasant, Depressed Affect: Constricted Anxiety Level: Minimal Thought Processes: Coherent, Relevant Judgement: Partial Orientation: Appropriate for developmental age Obsessive Compulsive Thoughts/Behaviors: Minimal(pertaining to break up)  Cognitive Functioning Concentration: Normal Memory: Recent Intact, Remote Intact Is patient IDD: No Insight: Good Impulse Control: Good Appetite: Good Have you had any weight changes? : No Change Sleep: Decreased Total Hours of Sleep: 4 Vegetative Symptoms: None  ADLScreening [facility] Assessment Services) Patient's cognitive ability adequate to safely complete daily activities?: Yes Patient able to express need for assistance with ADLs?: Yes Independently performs ADLs?: Yes (appropriate for developmental age)  Prior Inpatient Therapy Prior Inpatient Therapy: No  Prior Outpatient Therapy Prior Outpatient Therapy: No Does patient have an ACCT team?: No Does patient have Intensive In-House Services?  : No Does patient have [facility] services? : No Does patient have P4CC services?: No  ADL Screening (condition at time of admission) Patient's cognitive ability adequate to safely complete daily activities?: Yes Is the patient deaf or have difficulty hearing?: No Does the patient have difficulty seeing, even when wearing glasses/contacts?: No Does the patient have difficulty concentrating, remembering, or making decisions?: No Patient able to express need for  assistance with  ADLs?: Yes Does the patient have difficulty dressing or bathing?: No Independently performs ADLs?: Yes (appropriate for developmental age) Does the patient have difficulty walking or climbing stairs?: No Weakness of Legs: None Weakness of Arms/Hands: None  Home Assistive Devices/Equipment Home Assistive Devices/Equipment: None  Therapy Consults (therapy consults require a physician order) PT Evaluation Needed: No OT Evalulation Needed: No SLP Evaluation Needed: No   Values / Beliefs Cultural Requests During Hospitalization: None Spiritual Requests During Hospitalization: None Consults Spiritual Care Consult Needed: No Transition of Care Team Consult Needed: No Advance Directives (For Healthcare) Does Patient Have a Medical Advance Directive?: No Would patient like information on creating a medical advance directive?: No - Patient declined          Disposition: Priscille Loveless, NP recommended outpt psychiatric tx. Pt is interested in IOP- brochure given and referral made to [facility]. Disposition Initial Assessment Completed for this Encounter: Yes Disposition of Patient: Discharge(follow up with IOP or other outpt tx)  On Site Evaluation by:   Reviewed with Physician:    Richardean Chimera 12/15/2019 3:21 PM

## 2019-12-15 NOTE — H&P (Signed)
Behavioral Health Medical Screening Exam  Frederick Armstrong is an 24 y.o. male  With anxiety and suicidal thoughts. Patient presents with increased anxiety and stressors that include recent break up, community service deadline approaching (possession of schedule I conviction), working, and attending school for pharmacy tech. He is appropriate and states he is seeking help for his thoughts and anxiety. He does endorse suicidal thoughts and ideations, but states he is able to contract for safety and is too scared to do anything to himself. He denies any previous attempts at this time. He is interested in outpatient therapy. Collateral obtained from his grandmother (Frederick Armstrong) confirms patient history and she agrees patient would like not harm himself and just needs outpatient treatment.    Total Time spent with patient: 30 minutes  Psychiatric Specialty Exam: Physical Exam  Review of Systems  Blood pressure 122/67, pulse 78, temperature 98.3 F (36.8 C), temperature source Oral, resp. rate 18, SpO2 100 %.There is no height or weight on file to calculate BMI.  General Appearance: Fairly Groomed and oversized clothes  Eye Contact:  Fair  Speech:  Clear and Coherent and Normal Rate  Volume:  Normal  Mood:  Depressed  Affect:  Appropriate and Congruent  Thought Process:  Coherent, Linear and Descriptions of Associations: Intact  Orientation:  Full (Time, Place, and Person)  Thought Content:  Logical  Suicidal Thoughts:  Yes.  with intent/plan  Homicidal Thoughts:  No  Memory:  Immediate;   Fair Recent;   Fair  Judgement:  Fair  Insight:  Good  Psychomotor Activity:  Normal  Concentration: Concentration: Fair and Attention Span: Fair  Recall:  [employer] of Knowledge:Fair  Language: Fair  Akathisia:  No  Handed:  Right  AIMS (if indicated):     Assets:  Communication Skills Desire for Improvement Financial Resources/Insurance Housing Intimacy Leisure Time Physical Health Social  Support Transportation Vocational/Educational  Sleep:       Musculoskeletal: Strength & Muscle Tone: within normal limits Gait & Station: normal Patient leans: N/A  Blood pressure 122/67, pulse 78, temperature 98.3 F (36.8 C), temperature source Oral, resp. rate 18, SpO2 100 %.  Recommendations:  Based on my evaluation the patient does not appear to have an emergency medical condition. Patient endorses suicidal ideation however is able to contract for safety. He is alos able to forward information that includes protective factors and future oriented regarding his career. He reports that he has 30 hours remaining by 04/08 for community service and agreed he can complete them in about 3 days to eliminate one of his stressors. He is looking forward to completing school for pharmacy tech. He is open to therapy and provided a list of resources for outpatient therapy. He is advised if symptoms worsen or persist to return to the hospital for further management.  Maryagnes Amos, FNP 12/15/2019, 4:31 PM

## 2019-12-18 ENCOUNTER — Telehealth (HOSPITAL_COMMUNITY): Payer: Self-pay | Admitting: Professional

## 2019-12-19 ENCOUNTER — Other Ambulatory Visit: Payer: Self-pay

## 2019-12-19 ENCOUNTER — Other Ambulatory Visit (HOSPITAL_COMMUNITY): Payer: 59 | Attending: Psychiatry | Admitting: Licensed Clinical Social Worker

## 2019-12-19 DIAGNOSIS — F411 Generalized anxiety disorder: Secondary | ICD-10-CM | POA: Diagnosis present

## 2019-12-19 DIAGNOSIS — F332 Major depressive disorder, recurrent severe without psychotic features: Secondary | ICD-10-CM | POA: Insufficient documentation

## 2019-12-19 DIAGNOSIS — F331 Major depressive disorder, recurrent, moderate: Secondary | ICD-10-CM | POA: Insufficient documentation

## 2019-12-20 ENCOUNTER — Other Ambulatory Visit (HOSPITAL_COMMUNITY): Payer: 59 | Admitting: Licensed Clinical Social Worker

## 2019-12-20 ENCOUNTER — Other Ambulatory Visit: Payer: Self-pay

## 2019-12-20 ENCOUNTER — Other Ambulatory Visit (HOSPITAL_COMMUNITY): Payer: 59

## 2019-12-20 DIAGNOSIS — F411 Generalized anxiety disorder: Secondary | ICD-10-CM | POA: Diagnosis present

## 2019-12-20 DIAGNOSIS — F332 Major depressive disorder, recurrent severe without psychotic features: Secondary | ICD-10-CM | POA: Diagnosis present

## 2019-12-20 NOTE — Psych (Signed)
Virtual Visit via Video Note  I connected with Frederick Armstrong on 12/20/19 at  9:00 AM EDT by a video enabled telemedicine application and verified that I am speaking with the correct person using two identifiers.   I discussed the limitations of evaluation and management by telemedicine and the availability of in person appointments. The patient expressed understanding and agreed to proceed.   Follow Up Instructions:    I discussed the assessment and treatment plan with the patient. The patient was provided an opportunity to ask questions and all were answered. The patient agreed with the plan and demonstrated an understanding of the instructions.   The patient was advised to call back or seek an in-person evaluation if the symptoms worsen or if the condition fails to improve as anticipated.  I provided 10 minutes of non-face-to-face time during this encounter.  Patient verbally agrees to treatment plan.  Quinn Axe, [facility]

## 2019-12-20 NOTE — Psych (Signed)
Virtual Visit via Video Note  I connected with Frederick Armstrong on 12/19/19 at 11:00 AM EDT by a video enabled telemedicine application and verified that I am speaking with the correct person using two identifiers.   I discussed the limitations of evaluation and management by telemedicine and the availability of in person appointments. The patient expressed understanding and agreed to proceed.   Follow Up Instructions:    I discussed the assessment and treatment plan with the patient. The patient was provided an opportunity to ask questions and all were answered. The patient agreed with the plan and demonstrated an understanding of the instructions.   The patient was advised to call back or seek an in-person evaluation if the symptoms worsen or if the condition fails to improve as anticipated.  I provided 60 minutes of non-face-to-face time during this encounter.   Royetta Crochet, [facility]    Comprehensive Clinical Assessment (CCA) Note  12/19/2019 Frederick Armstrong [id number]  Visit Diagnosis:      ICD-10-CM   1. Generalized anxiety disorder  F41.1   2. Severe episode of recurrent major depressive disorder, without psychotic features ([place])  F33.2       CCA Part One  Part One has been completed on paper by the patient.  (See scanned document in Chart Review)  CCA Part Two A  Intake/Chief Complaint:  CCA Intake With Chief Complaint CCA Part Two Date: 12/19/19 CCA Part Two Time: 24 Chief Complaint/Presenting Problem: Pt reports to PHP per inpt eval. Pt went to be evaluated due to increased SI, denies intent/plan. Pt denies previous attempts. Pt denies previous history of mental health treatment. Pt reports history of anxiety, recently increased, and increased depression. Pt identifies the following stressors: 1) Work: Reports supportive work environment, though has been overwhelmed recently. Pt has had to leave work "at least 5x in the last few weeks" due to being unable to manage  symptoms. 2) Relationship: Pt reports his girlfriend of 11 months broke up with him about 1 month ago. Pt shares he is unsure of the reason why which is causing him stress. 3) Court: Pt was charged with possession of marijuana in 2019, Pt has 15 hours of community service to complete prior to 12/28/19 and is on probation until 03/11/20. Pt denies current SI/HI/AVH. Patients Currently Reported Symptoms/Problems: Increased SI (no intent/plan at this time); increased depression; increased anxiety; lack of concentration/motivation; feelings of worthless/hopelessness; decreased sleep (3 hrs max at a time); panic attacks; racing thoughts; fatigue; Collateral Involvement: inpt notes Individual's Strengths: motivation for treatment Individual's Preferences: to feel less anxiety and SI Individual's Abilities: can attend and participate in treatment Type of Services Patient Feels Are Needed: PHP  Mental Health Symptoms Depression:  Depression: Change in energy/activity, Difficulty Concentrating, Fatigue, Hopelessness, Sleep (too much or little), Tearfulness, Worthlessness  Mania:     Anxiety:   Anxiety: Difficulty concentrating, Fatigue, Restlessness, Tension, Worrying, Sleep  Psychosis:     Trauma:     Obsessions:     Compulsions:     Inattention:     Hyperactivity/Impulsivity:     Oppositional/Defiant Behaviors:     Borderline Personality:     Other Mood/Personality Symptoms:      Mental Status Exam Appearance and self-care  Stature:  Stature: Average  Weight:  Weight: Average weight  Clothing:  Clothing: Casual  Grooming:  Grooming: Normal  Cosmetic use:  Cosmetic Use: None  Posture/gait:  Posture/Gait: Normal  Motor activity:  Motor Activity: Not Remarkable  Sensorium  Attention:  Attention: Normal  Concentration:  Concentration: Normal  Orientation:     Recall/memory:  Recall/Memory: Normal  Affect and Mood  Affect:  Affect: Appropriate, Depressed  Mood:  Mood: Depressed, Anxious   Relating  Eye contact:  Eye Contact: Fleeting  Facial expression:  Facial Expression: Depressed  Attitude toward examiner:  Attitude Toward Examiner: Cooperative  Thought and Language  Speech flow: Speech Flow: Normal  Thought content:  Thought Content: Appropriate to mood and circumstances  Preoccupation:  Preoccupations: Ruminations  Hallucinations:     Organization:     Company secretary of Knowledge:  Fund of Knowledge: Average  Intelligence:  Intelligence: Average  Abstraction:  Abstraction: Normal  Judgement:  Judgement: Fair  Dance movement psychotherapist:  Reality Testing: Adequate  Insight:  Insight: Fair  Decision Making:  Decision Making: Vacilates  Social Functioning  Social Maturity:  Social Maturity: Responsible  Social Judgement:  Social Judgement: Normal  Stress  Stressors:  Stressors: Grief/losses, Illness, Transitions  Coping Ability:  Coping Ability: Horticulturist, commercial Deficits:     Supports:      Family and Psychosocial History: Family history Marital status: Single Are you sexually active?: Yes What is your sexual orientation?: heterosexual Does patient have children?: No  Childhood History:  Childhood History By whom was/is the patient raised?: Mother, Father, Grandparents Additional childhood history information: Pt reports father has been more distant in life because of living in [place]. Pt lived with paternal grandfather for "2-3 years in elementary school" while siblings were in foster care. Pt is not sure why mother did not have custody of him and siblings at the time; pt has declined to ask mother Description of patient's relationship with caregiver when they were a child: Pt reports a close relationship with mother, grandmother, and grandfather Patient's description of current relationship with people who raised him/her: Good relationship with all How were you disciplined when you got in trouble as a child/adolescent?: grounding Does patient have siblings?:  Yes Number of Siblings: 2 Description of patient's current relationship with siblings: 1 younger sister: good relationship- live together; 1 younger brother- do not communicate Did patient suffer any verbal/emotional/physical/sexual abuse as a child?: No Did patient suffer from severe childhood neglect?: No Has patient ever been sexually abused/assaulted/raped as an adolescent or adult?: No Was the patient ever a victim of a crime or a disaster?: No Witnessed domestic violence?: No Has patient been effected by domestic violence as an adult?: No  CCA Part Two B  Employment/Work Situation: Employment / Work Psychologist, occupational Employment situation: Employed 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?: No Are There Guns or Other Weapons in Your Home?: No  Education: Education Did Garment/textile technologist From [employer]?: Yes Did Theme park manager?: Yes What Type of College Degree Do you Have?: did not complete Did Designer, television/film set?: No Did You Have An Individualized Education Program (IIEP): No Did You Have Any Difficulty At [employer]?: No  Religion: Religion/Spirituality Are You A Religious Person?: No  Leisure/Recreation: Leisure / Recreation Leisure and Hobbies: making music  Exercise/Diet: Exercise/Diet Do You Exercise?: No Have You Gained or Lost A Significant Amount of Weight in the Past Six Months?: No Do You Follow  a Special Diet?: No Do You Have Any Trouble Sleeping?: Yes Explanation of Sleeping Difficulties: Pt is unable to stay asleep longer than 3 hours at a time  CCA Part Two C  Alcohol/Drug Use: Alcohol / Drug Use Pain Medications: None  reported Prescriptions: None reported Over the Counter: None reported History of alcohol / drug use?: Yes Negative Consequences of Use: Legal Substance #1 Name of Substance 1: thc 1 - Frequency: twice daily 1 - Duration: ongoing 1 - Last Use / Amount: 12/14/19                    CCA Part Three  ASAM's:  Six Dimensions of Multidimensional Assessment  Dimension 1:  Acute Intoxication and/or Withdrawal Potential:     Dimension 2:  Biomedical Conditions and Complications:     Dimension 3:  Emotional, Behavioral, or Cognitive Conditions and Complications:     Dimension 4:  Readiness to Change:     Dimension 5:  Relapse, Continued use, or Continued Problem Potential:     Dimension 6:  Recovery/Living Environment:      Substance use Disorder (SUD)    Social Function:  Social Functioning Social Maturity: Responsible Social Judgement: Normal  Stress:  Stress Stressors: Grief/losses, Illness, Transitions Coping Ability: Exhausted Patient Takes Medications The Way The Doctor Instructed?: Yes Priority Risk: Moderate Risk  Risk Assessment- Self-Harm Potential: Risk Assessment For Self-Harm Potential Thoughts of Self-Harm: Vague current thoughts Method: No plan Additional Comments for Self-Harm Potential: Pt denies previous attempts  Risk Assessment -Dangerous to Others Potential: Risk Assessment For Dangerous to Others Potential Method: No Plan  DSM5 Diagnoses: Patient Active Problem List   Diagnosis Date Noted  . Major depressive disorder, recurrent episode, moderate (HCC) 12/19/2019  . Generalized anxiety disorder 12/19/2019  . Major depressive disorder, recurrent episode, severe (HCC) 12/19/2019  . Intercostal pain 10/22/2014  . Pain in the chest 10/22/2014    Patient Centered Plan: Patient is on the following Treatment Plan(s):  Depression  Recommendations for Services/Supports/Treatments: Recommendations for Services/Supports/Treatments Recommendations For  Services/Supports/Treatments: Partial Hospitalization(Pt was evaluated at inpt and ref to PHP. Pt has recent SI, denies intent/plan. Able to contract for safety. Pt has no history of MH tx; wants to learn coping skills and needs med management)  Treatment Plan Summary:   Pt states, "I just want to not stress as much. I kinda want to live in the moment."  Referrals to Alternative Service(s): Referred to Alternative Service(s):   Place:   Date:   Time:    Referred to Alternative Service(s):   Place:   Date:   Time:    Referred to Alternative Service(s):   Place:   Date:   Time:    Referred to Alternative Service(s):   Place:   Date:   Time:     Quinn Axe, [facility], LCASA

## 2019-12-21 ENCOUNTER — Other Ambulatory Visit (HOSPITAL_COMMUNITY): Payer: 59

## 2019-12-21 ENCOUNTER — Other Ambulatory Visit: Payer: Self-pay

## 2019-12-21 ENCOUNTER — Other Ambulatory Visit (HOSPITAL_COMMUNITY): Payer: 59 | Attending: Psychiatry | Admitting: Licensed Clinical Social Worker

## 2019-12-21 DIAGNOSIS — F329 Major depressive disorder, single episode, unspecified: Secondary | ICD-10-CM | POA: Diagnosis present

## 2019-12-21 DIAGNOSIS — F411 Generalized anxiety disorder: Secondary | ICD-10-CM

## 2019-12-21 DIAGNOSIS — F322 Major depressive disorder, single episode, severe without psychotic features: Secondary | ICD-10-CM | POA: Diagnosis not present

## 2019-12-21 DIAGNOSIS — F332 Major depressive disorder, recurrent severe without psychotic features: Secondary | ICD-10-CM

## 2019-12-21 DIAGNOSIS — F419 Anxiety disorder, unspecified: Secondary | ICD-10-CM | POA: Diagnosis not present

## 2019-12-22 ENCOUNTER — Encounter (HOSPITAL_COMMUNITY): Payer: Self-pay

## 2019-12-22 ENCOUNTER — Other Ambulatory Visit: Payer: Self-pay

## 2019-12-22 ENCOUNTER — Other Ambulatory Visit (HOSPITAL_COMMUNITY): Payer: 59 | Admitting: Licensed Clinical Social Worker

## 2019-12-22 ENCOUNTER — Other Ambulatory Visit (HOSPITAL_COMMUNITY): Payer: 59

## 2019-12-22 DIAGNOSIS — F411 Generalized anxiety disorder: Secondary | ICD-10-CM

## 2019-12-22 DIAGNOSIS — F332 Major depressive disorder, recurrent severe without psychotic features: Secondary | ICD-10-CM

## 2019-12-22 MED ORDER — TRAZODONE HCL 50 MG PO TABS: 50.0000 mg | ORAL_TABLET | Freq: Every day | ORAL | 0 refills | Status: DC

## 2019-12-22 MED ORDER — HYDROXYZINE HCL 10 MG PO TABS: 10.0000 mg | ORAL_TABLET | Freq: Three times a day (TID) | ORAL | 0 refills | Status: DC | PRN

## 2019-12-22 NOTE — Progress Notes (Signed)
Spoke with patient via Webex video call, used 2 identifiers to correctly identify patient. States his mother recommended he go to [facility] to be seen because he was crying and very anxious. He was then recommended for PHP. He only had thoughts of suicide once in past few weeks but it was passive with no plan. He does not feel suicidal or homicidal. No AV hallucinations. He has been having a lot of stress/anxiety over the past month. 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 Recently had a break up from a girlfriend of 11 months, his longest relationship to date. On scale 1-10 as 10 being worst he rates depression at 3 and anxiety at 8. Takes no medication at this time. Has trouble falling asleep. So far is enjoying groups and hearing that others are going through some of the same things. No other issues or complaints. [id number]

## 2019-12-22 NOTE — Progress Notes (Signed)
Virtual Visit via Video Note  I connected with Frederick Armstrong on 12/22/19 at  9:00 AM EDT by a video enabled telemedicine application and verified that I am speaking with the correct person using two identifiers.   I discussed the limitations of evaluation and management by telemedicine and the availability of in person appointments. The patient expressed understanding and agreed to proceed.     I discussed the assessment and treatment plan with the patient. The patient was provided an opportunity to ask questions and all were answered. The patient agreed with the plan and demonstrated an understanding of the instructions.   The patient was advised to call back or seek an in-person evaluation if the symptoms worsen or if the condition fails to improve as anticipated.  I provided 15 minutes of non-face-to-face time during this encounter.   Derrill Center, NP    Behavioral Health Partial Program Assessment Note  Date: 12/22/2019 Name: Frederick Armstrong MRN: [id number]    HPI: Patient is a 24 y.o. African American male presents with depression and anxiety after recent break-up.  Reports feeling very stressed, anxious, symptoms of worry and difficulty sleeping.  Reports situational depression denies previous suicide attempts in the past.  Reports stressors related to a upcoming court case.  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  Denies illicit drug use.  Denied family history of mental illness.  Denied self injures behaviors.  Denied previous inpatient admissions.  Denied that has been followed by therapist and/or psychiatrist in the past.  Discussed initiating Zoloft hydroxyzine and trazodone.  Patient declined antidepressant at this time.  Will initiate hydroxyzine for anxiety and trazodone for sleep reported issues.  We will follow-up with patient early next week for additional medication management.  Patient was enrolled in partial psychiatric program on  12/22/19.  Primary complaints include: anxiety.  Onset of symptoms was gradual with stable course since that time. Psychosocial Stressors include the following: financial.   I have reviewed the following documentation dated 12/21/2019: past psychiatric history, past medical history and past social and family history  Complaints of Pain: nonear Past Psychiatric History:  First psychiatric contact   Currently in treatment with denied.  Substance Abuse History: none Use of Alcohol: denied Use of Caffeine: denies use Use of over the counter:   History reviewed. No pertinent surgical history.  Past Medical History:  Diagnosis Date  . Childhood asthma    No outpatient encounter medications on file as of 12/22/2019.   No facility-administered encounter medications on file as of 12/22/2019.   No Known Allergies  Social History   Tobacco Use  . Smoking status: Never Smoker  . Smokeless tobacco: Never Used  Substance Use Topics  . Alcohol use: No    Alcohol/week: 0.0 standard drinks   Functioning Relationships: good support system Education: College       Please specify degree:  Other Pertinent History: Community service case pending Family History  Problem Relation Age of Onset  . Cervical cancer Maternal Grandmother   . Hypertension Maternal Grandmother   . Hypertension Maternal Grandfather   . Heart disease Neg Hx   . Hyperlipidemia Neg Hx      Review of Systems Constitutional: negative  Objective:  There were no vitals filed for this visit.  Physical Exam:   Mental Status Exam: Appearance:  Well groomed Psychomotor::  Within Normal Limits Attention span and concentration: Normal Behavior: calm and cooperative Speech:  normal volume Mood:  depressed Affect:  normal Thought Process:  Coherent Thought Content:  WDL Orientation:  person, place and time/date Cognition:  grossly intact Insight:  Intact Judgment:  Intact Estimate of Intelligence: Average Fund  of knowledge: Aware of current events Memory: Recent and remote intact Abnormal movements: None Gait and station: Normal  Assessment:  Diagnosis: No primary diagnosis found. No diagnosis found.  Indications for admission: inpatient care required if not in partial hospital program  Plan: Orders placed for occupational therapy patient enrolled in Partial Hospitalization Program, patient's current medications are to be continued, the following medications are being prescribed hydroxyzine and trazodone, a comprehensive treatment plan will be developed and side effects of medications have been reviewed with patient  Treatment options and alternatives reviewed with patient and patient understands the above plan.  Treatment plan was reviewed and agreed upon by NP T. Melvyn Neth and patient Frederick Armstrong need for group services   Discussed initiating SSRI for depression patient declined, however reported he will consider this at a later date. - patient to start hydroxyzine 25 mg po PRN for anxiety and Trazodone 50 mg  for insomnia     Oneta Rack, NP

## 2019-12-25 ENCOUNTER — Other Ambulatory Visit (HOSPITAL_COMMUNITY): Payer: 59

## 2019-12-25 ENCOUNTER — Telehealth (HOSPITAL_COMMUNITY): Payer: Self-pay | Admitting: Professional

## 2019-12-25 ENCOUNTER — Other Ambulatory Visit: Payer: Self-pay

## 2019-12-26 ENCOUNTER — Other Ambulatory Visit: Payer: Self-pay

## 2019-12-26 ENCOUNTER — Other Ambulatory Visit (HOSPITAL_COMMUNITY): Payer: 59 | Admitting: Licensed Clinical Social Worker

## 2019-12-26 ENCOUNTER — Other Ambulatory Visit (HOSPITAL_COMMUNITY): Payer: 59

## 2019-12-26 DIAGNOSIS — F332 Major depressive disorder, recurrent severe without psychotic features: Secondary | ICD-10-CM

## 2019-12-26 DIAGNOSIS — F411 Generalized anxiety disorder: Secondary | ICD-10-CM

## 2019-12-27 ENCOUNTER — Other Ambulatory Visit: Payer: Self-pay

## 2019-12-27 ENCOUNTER — Telehealth (HOSPITAL_COMMUNITY): Payer: Self-pay | Admitting: Professional

## 2019-12-27 ENCOUNTER — Other Ambulatory Visit (HOSPITAL_COMMUNITY): Payer: 59

## 2019-12-27 ENCOUNTER — Encounter (HOSPITAL_COMMUNITY): Payer: Self-pay | Admitting: Family

## 2019-12-27 NOTE — Progress Notes (Signed)
  [facility] Behavioral Health Partial Hosptilizaiton Outpatient Program Discharge Summary  Frederick Armstrong [id number]  Admission date: 12/22/2019 Discharge date: 12/27/2019  Reason for admission: Frederick Armstrong is a 24 y.o. African American male presents with depression and anxiety after recent break-up.  Reports feeling very stressed, anxious, symptoms of worry and difficulty sleeping.  Reports situational depression denies previous suicide attempts in the past.  Reports stressors related to a upcoming court case.  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  Denies illicit drug use.  Denied family history of mental illness.  Denied self injures behaviors.  Denied previous inpatient admissions.  Denied that has been followed by therapist and/or psychiatrist in the past.  Discussed initiating Zoloft hydroxyzine and trazodone.  Patient declined antidepressant at this time.  Will initiate hydroxyzine for anxiety and trazodone for sleep reported issues.  We will follow-up with patient early next week for additional medication management.  Patient was enrolled in partial psychiatric program on 12/22/19.   Progress (rationale):Ongoing, NP attempted to follow-up with patient and left message for group participation and if patient may need additional outpatient resources. Chart reviewed multiple attempts made to follow-up with patient regarding absences to partial hospitalization programming.  Patient was not evaluated at discharge.  Take all medications as prescribed. Keep all follow-up appointments as scheduled.  Do not consume alcohol or use illegal drugs while on prescription medications. Report any adverse effects from your medications to your primary care provider promptly.  In the event of recurrent symptoms or worsening symptoms, call 911, a crisis hotline, or go to the nearest emergency department for evaluation.   Oneta Rack, NP 12/27/2019

## 2019-12-28 ENCOUNTER — Ambulatory Visit (HOSPITAL_COMMUNITY): Payer: 59

## 2019-12-28 ENCOUNTER — Other Ambulatory Visit (HOSPITAL_COMMUNITY): Payer: 59

## 2019-12-29 ENCOUNTER — Other Ambulatory Visit (HOSPITAL_COMMUNITY): Payer: 59

## 2019-12-29 ENCOUNTER — Ambulatory Visit (HOSPITAL_COMMUNITY): Payer: 59

## 2020-01-01 ENCOUNTER — Ambulatory Visit (HOSPITAL_COMMUNITY)
Admission: AD | Admit: 2020-01-01 | Discharge: 2020-01-01 | Disposition: A | Discharge status: Home / Self Care | Payer: 59 | Attending: Psychiatry | Admitting: Psychiatry

## 2020-01-01 ENCOUNTER — Other Ambulatory Visit (HOSPITAL_COMMUNITY): Payer: 59

## 2020-01-01 DIAGNOSIS — F329 Major depressive disorder, single episode, unspecified: Secondary | ICD-10-CM | POA: Diagnosis present

## 2020-01-01 MED ORDER — SERTRALINE HCL 25 MG PO TABS: 25.0000 mg | ORAL_TABLET | Freq: Every day | ORAL | 0 refills | Status: DC

## 2020-01-01 MED ORDER — SERTRALINE HCL 25 MG PO TABS
25.0000 mg | ORAL_TABLET | Freq: Every day | ORAL | Status: DC
  Filled 2020-01-01 (×3): qty 1

## 2020-01-01 NOTE — H&P (Signed)
Behavioral Health Medical Screening Exam  Frederick Armstrong is an 24 y.o. male presented to [facility] as a walk in with complaints of depression.  Patient states that he was in the [facility] program but was unable to open up in front of strangers and would feel better with individual therapy.  Patient also refused to start Zoloft but has agreed to start now.  Patient states that he has passive suicidal thoughts of "the world would be better off without me but I would never do anything to kill myself."  Patient states that he is employed and lives with his sister.  States that his grandparents are also supportive.  Patient wants to  Continue outpatient services with individual therapy; and medication management.  Discussed the efficacy and side effects of Zoloft 25 mg.  RX for 30 tablet ordered.  Patient will follow up at [facility] outpatient services.    Total Time spent with patient: 30 minutes  Psychiatric Specialty Exam: Physical Exam  Constitutional: He appears well-developed and well-nourished. No distress.    Review of Systems  Blood pressure 134/84, pulse 70, temperature 97.6 F (36.4 C), temperature source Oral, resp. rate 18, SpO2 100 %.There is no height or weight on file to calculate BMI.  General Appearance: Casual  Eye Contact:  Good  Speech:  Clear and Coherent and Normal Rate  Volume:  Normal  Mood:  "Okay"  Affect:  Appropriate and Congruent  Thought Process:  Coherent, Goal Directed and Descriptions of Associations: Intact  Orientation:  Full (Time, Place, and Person)  Thought Content:  WDL  Suicidal Thoughts:  No  Homicidal Thoughts:  No  Memory:  Immediate;   Good Recent;   Good  Judgement:  Intact  Insight:  Present  Psychomotor Activity:  Normal  Concentration: Concentration: Good and Attention Span: Good  Recall:  Good  Fund of Knowledge:Good  Language: Good  Akathisia:  No  Handed:  Right  AIMS (if indicated):     Assets:  Communication Skills Desire for  Improvement Financial Resources/Insurance Housing Leisure Time Physical Health Social Support Transportation  Sleep:       Musculoskeletal: Strength & Muscle Tone: within normal limits Gait & Station: normal Patient leans: N/A  Blood pressure 134/84, pulse 70, temperature 97.6 F (36.4 C), temperature source Oral, resp. rate 18, SpO2 100 %.  Recommendations:  Outpatient psychiatric services Stated on Zoloft 25 mg daily for depression.  Follow up with [facility] outpatient in 2 weeks or less if adverse reaction to medication.    Based on my evaluation the patient does not appear to have an emergency medical condition.   Disposition: No evidence of imminent risk to self or others at present.   Patient does not meet criteria for psychiatric inpatient admission. Supportive therapy provided about ongoing stressors. Discussed crisis plan, support from social network, calling 911, coming to the Emergency Department, and calling Suicide Hotline.  Shakinah Navis, NP 01/01/2020, 1:25 PM

## 2020-01-01 NOTE — BHH Assessment (Incomplete)
Assessment Note ? ?Rushi Chasen is an 24 y.o. male that presents this date with passive S/I. Patient denies any plan or intent. Patient denies any H/I or AVH. Patient was seen on 12/15/19 when he presented with similar symptoms. Patient was recommended for a OP program and has started in IOP since then reporting he has attended two groups and decided "it wasn't for him" reporting "he didn't feel comfortable talking in front of other people." Patient states he is currently "frustrated with himself" and is coming back this date for "advice on what to do next." Patient reports he was started on a sleep medication (Trazdone) and another medication (Vistril) for anxiety which he states he has been compliant with although has only taken "one pill" of the Trazadone because it is to sedative.  Patient does report that he only took his sleep medication "one time" staking altl  . Marcella Dunnaway is a single 24 y.o. male who presents voluntarily to [facility] for a walk-in assessment. Pt was accompanied by his grandparents who waited in the lobby/car. Pt is reporting symptoms of depression with suicidal ideation. Pt has no history of psychiatric dx or tx. His mother wanted pt to come for assessment. Pt reports current suicidal ideation earlier this day with plans of an intentional car accident. Past attempts include none. Pt acknowledges multiple symptoms of Depression, including anhedonia, isolating, feelings of worthlessness, & changes in sleep. Pt denies homicidal ideation/ history of violence. Pt denies auditory & visual hallucinations & other symptoms of psychosis. Pt states current stressors include a recent breakup with a girl he was seeing for 11 months. He is also stressed by still having to complete 30 of required 100 hours of community service requirement by 12/28/19 (for a possession charge in 2019). ?? ? Pt lives with his sister, and supports include mother and good friend. Pt denies hx of abuse. He was removed from his mother's care by CPS for a few years but knows little

## 2020-01-02 ENCOUNTER — Ambulatory Visit (HOSPITAL_COMMUNITY): Payer: 59

## 2020-01-02 ENCOUNTER — Other Ambulatory Visit (HOSPITAL_COMMUNITY): Payer: 59

## 2020-01-03 ENCOUNTER — Other Ambulatory Visit (HOSPITAL_COMMUNITY): Payer: 59

## 2020-01-04 ENCOUNTER — Other Ambulatory Visit (HOSPITAL_COMMUNITY): Payer: 59

## 2020-01-04 ENCOUNTER — Ambulatory Visit (HOSPITAL_COMMUNITY): Payer: 59

## 2020-01-04 NOTE — Psych (Signed)
Virtual Visit via Video Note  I connected with Frederick Armstrong on 12/20/19 at  9:00 AM EDT by a video enabled telemedicine application and verified that I am speaking with the correct person using two identifiers.   I discussed the limitations of evaluation and management by telemedicine and the availability of in person appointments. The patient expressed understanding and agreed to proceed.  I discussed the assessment and treatment plan with the patient. The patient was provided an opportunity to ask questions and all were answered. The patient agreed with the plan and demonstrated an understanding of the instructions.   The patient was advised to call back or seek an in-person evaluation if the symptoms worsen or if the condition fails to improve as anticipated.  Pt was provided 180 minutes of non-face-to-face time during this encounter.   Lorin Glass, LCSW    [facility] PHP THERAPIST PROGRESS NOTE  Frederick Armstrong [id number]  Session Time: 9:00 - 10:00  Participation Level: Active  Behavioral Response: CasualAlertDepressed  Type of Therapy: Group Therapy  Treatment Goals addressed: Coping  Interventions: CBT, DBT, Supportive and Reframing  Summary: Clinician led check-in regarding current stressors and situation, and review of patient completed daily inventory. Clinician utilized active listening and empathetic response and validated patient emotions. Clinician facilitated processing group on pertinent issues.   Therapist Response: Akshith Moncus is a 24 y.o. male who presents with depression and anxiety symptoms.  Patient arrived within time allowed and reports that he is feeling "okay." Patient rates hismood at [facility] a scale of 1-10 with 10 being great. Pt states he spent yesterday at work, doing [employer], and studying. Pt reports struggles with rumination and sleep. Pt is able to process. Patient engaged in discussion.      Session Time: 10:00 -11:00  Participation  Level:Active  Behavioral Response:CasualAlertDepressed  Type of Therapy: Group Therapy, psychoeducation, psychotherapy  Treatment Goals addressed: Coping  Interventions:CBT, DBT, Solution Focused, Supportive and Reframing  Summary:Cln led discussion on insecurity. Group members discussed struggles with insecurity and the ways in which it presents and affects them. Cln discussed positive affirmations as a way to manage insecurity. Group members brainstormed positive affirmations to use and coach themselves on.   Therapist Response:Pt engaged in discussion and reports struggling with insecure thoughts specifically around where he "should" be in life. Pt reports willingness to use positive affirmations to remind them of their value.       Session Time: 11:00 -12:00  Participation Level:Active  Behavioral Response:CasualAlertDepressed  Type of Therapy: Group Therapy, psychotherapy  Treatment Goals addressed: Coping  Interventions:Strengths based, reframing, Supportive,   Summary:Spiritual Care group  Therapist Response: Patient engaged in group. See chaplain note.        Session Time: 12:00- 1:00  Participation Level:Did not attend  Behavioral Response:CasualAlertDepressed  Type of Therapy: Group Therapy, OT  Treatment Goals addressed: Coping  Interventions:Psychosocial skills training, Supportive  Summary:12:00 - 12:50 Occupational Therapy group 12:50 -1:00 Clinician led check-out. Clinician assessed for immediate needs, medication compliance and efficacy, and safety concerns  Therapist Response:Patient did not attend group. Pt states he was incorrect about the length of group and needed to report to work.       Suicidal/Homicidal: Nowithout intent/plan  Plan: Pt will continue in PHP while working to decrease depression and anxiety symptoms and increase ability to   Diagnosis: Severe episode of recurrent major  depressive disorder, without psychotic features ([place]) [F33.2]    1. Severe episode of recurrent major depressive disorder, without psychotic features (  HCC)   2. Generalized anxiety disorder       Donia Guiles, LCSW 01/04/2020

## 2020-01-05 ENCOUNTER — Other Ambulatory Visit (HOSPITAL_COMMUNITY): Payer: 59

## 2020-01-05 ENCOUNTER — Ambulatory Visit (HOSPITAL_COMMUNITY): Payer: 59

## 2020-01-08 ENCOUNTER — Other Ambulatory Visit (HOSPITAL_COMMUNITY): Payer: 59

## 2020-01-08 NOTE — Psych (Signed)
Virtual Visit via Video Note  I connected with Frederick Armstrong on 12/22/19 at  9:00 AM EDT by a video enabled telemedicine application and verified that I am speaking with the correct person using two identifiers.   I discussed the limitations of evaluation and management by telemedicine and the availability of in person appointments. The patient expressed understanding and agreed to proceed.  I discussed the assessment and treatment plan with the patient. The patient was provided an opportunity to ask questions and all were answered. The patient agreed with the plan and demonstrated an understanding of the instructions.   The patient was advised to call back or seek an in-person evaluation if the symptoms worsen or if the condition fails to improve as anticipated.  Pt was provided 120 minutes of non-face-to-face time during this encounter.   Frederick Glass, LCSW    [facility] PHP THERAPIST PROGRESS NOTE  Frederick Armstrong [id number]  Session Time: 9:00 - 10:00  Participation Level: Active  Behavioral Response: CasualAlertDepressed  Type of Therapy: Group Therapy  Treatment Goals addressed: Coping  Interventions: CBT, DBT, Supportive and Reframing  Summary: Clinician led check-in regarding current stressors and situation, and review of patient completed daily inventory. Clinician utilized active listening and empathetic response and validated patient emotions. Clinician facilitated processing group on pertinent issues  Therapist Response: Frederick Armstrong is a 24 y.o. male who presents with depression and anxiety symptoms.  Patient arrived within time allowed and reports that he isfeeling "good."Patient rates his mood at a 7 on a scale of 1-10 with 10 being great. 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 Pt states he talked to his mom and watched tv. Pt reports struggling with focus. Pt able to process. Pt engaged in discussion.        Session  Time: 10:00-11:00  Participation Level:Active  Behavioral Response:CasualAlertDepressed  Type of Therapy: Group Therapy  Treatment Goals addressed: Coping  Interventions:CBT, DBT, Solution Focused, Supportive and Reframing  Summary:Cln led discussion on procrastination. Cln discussed two of the possible reasons for procrastination including low motivation/fatigue secondary to depression and anxiety symptoms and anxiety/worry about dealing with an aspect of the task you are procrastinating. Cln encouraged group members to consider the underlying issue and addressing them if applicable to an instance of procrastination. Group members shared ways they connect with these two reasons as well as tasks they are procrastinating due to one of these reasons. Group members provided support and processed issues brought up.    Therapist Response: Pt minimally engaged in discussion.  Pt met with provider T. Lewis during part of this session. Pt did not return to group after meeting with provider. Cln attempted to reach out by phone and was unable to connect with pt.      Suicidal/Homicidal: Nowithout intent/plan  Plan: Pt will continue in PHP while working to decrease depression and anxiety symptoms and increase ability to manage symptoms in a healthy manner.   Diagnosis: Severe episode of recurrent major depressive disorder, without psychotic features (Frederick Armstrong) [F33.2]    1. Severe episode of recurrent major depressive disorder, without psychotic features (Frederick Armstrong)   2. Generalized anxiety disorder       Frederick Glass, LCSW 01/08/2020

## 2020-01-08 NOTE — Psych (Signed)
Virtual Visit via Video Note  I connected with Frederick Armstrong on 12/21/19 at  9:00 AM EDT by a video enabled telemedicine application and verified that I am speaking with the correct person using two identifiers.   I discussed the limitations of evaluation and management by telemedicine and the availability of in person appointments. The patient expressed understanding and agreed to proceed.  I discussed the assessment and treatment plan with the patient. The patient was provided an opportunity to ask questions and all were answered. The patient agreed with the plan and demonstrated an understanding of the instructions.   The patient was advised to call back or seek an in-person evaluation if the symptoms worsen or if the condition fails to improve as anticipated.  Pt was provided 240 minutes of non-face-to-face time during this encounter.   Lorin Glass, LCSW    [facility] PHP THERAPIST PROGRESS NOTE  Carlton Buskey [id number]  Session Time: 9:00 - 10:00  Participation Level: Active  Behavioral Response: CasualAlertDepressed  Type of Therapy: Group Therapy  Treatment Goals addressed: Coping  Interventions: CBT, DBT, Supportive and Reframing  Summary: Clinician led check-in regarding current stressors and situation, and review of patient completed daily inventory. Clinician utilized active listening and empathetic response and validated patient emotions. Clinician facilitated processing group on pertinent issues  Therapist Response: Torell Minder is a 24 y.o. male who presents with depression and anxiety symptoms.  Patient arrived within time allowed and reports that he isfeeling "very tired."Patient rates his mood at a 5 on a scale of 1-10 with 10 being great. 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 Pt states he talked to friends and watched tv. Pt reports struggling with sleep and sleeping approximately 2 hours. Pt able to process. Pt engaged  in discussion.        Session Time: 10:00-11:00  Participation Level:Active  Behavioral Response:CasualAlertDepressed  Type of Therapy:Group Therapy  Treatment Goals addressed: Coping  Interventions:CBT, DBT, Solution Focused, Supportive and Reframing  Summary:Cln led discussion on mindfulness. Cln introduced the "what" and "how" skills of mindfulness and how mindfulness can aid our mental health. Cln connected mindfulness to increased ability to utilize distress tolerance skills and cognitive distortions. Group problem-solved struggles with the concept of mindfulness.   Therapist Response: Pt engaged in discussion and reports understanding of mindfulness. Pt states mindfulness could be helpful to prevent thought spiraling.      Session Time: 11:00- 12:00  Participation Level:Active  Behavioral Response:CasualAlertDepressed  Type of Therapy:Group Therapy  Treatment Goals addressed: Coping  Interventions:CBT, DBT, Solution Focused, Supportive and Reframing  Summary:Cln continued topic of boundaries. Group utilized hand-out "How to set a boundary" to discuss process of setting healthy boundaries. Group discussed how to problem solve common boundary issues.  Therapist Response: Pt engaged in discussion and reports understanding of how to set a boundary.       Session Time: 12:00 -1:00  Participation Level:Active  Behavioral Response:CasualAlertDepressed  Type of Therapy:Group therapy  Treatment Goals addressed: Coping  Interventions:CBT; Solution focused; Supportive; Reframing  Summary:12:00 - 12:50:Cln continued topic of boundaries. Cln led group in a boundary workshop in which group members shared current boundary issues and group worked together to address issues utilizing boundary tenets discussed previously.  12:50 -1:00 Clinician led check-out. Clinician assessed for immediate needs, medication  compliance and efficacy, and safety concerns  Therapist Response:12:00 - 12:50: Pt engaged in activity and discussion. Pt demonstrates understanding of how to set and enforce  boundaries throughout activity.   At check-out, patient rates hismood at [facility] a scale of 1-10 with 10 being great. 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 Patient demonstrates some progress as evidenced by engaging in prosocial activities. Patient denies SI/HI/self-harm thoughts at the end of group.    Suicidal/Homicidal: Nowithout intent/plan  Plan: Pt will continue in PHP while working to decrease depression and anxiety symptoms and increase ability to manage symptoms in a healthy manner.   Diagnosis: Severe episode of recurrent major depressive disorder, without psychotic features (HCC) [F33.2]    1. Severe episode of recurrent major depressive disorder, without psychotic features (HCC)   2. Generalized anxiety disorder       Donia Guiles, LCSW 01/08/2020

## 2020-01-09 ENCOUNTER — Ambulatory Visit (HOSPITAL_COMMUNITY): Payer: 59

## 2020-01-09 ENCOUNTER — Other Ambulatory Visit (HOSPITAL_COMMUNITY): Payer: 59

## 2020-01-10 ENCOUNTER — Other Ambulatory Visit (HOSPITAL_COMMUNITY): Payer: 59

## 2020-01-11 ENCOUNTER — Other Ambulatory Visit (HOSPITAL_COMMUNITY): Payer: 59

## 2020-01-11 ENCOUNTER — Ambulatory Visit (HOSPITAL_COMMUNITY): Payer: 59

## 2020-01-12 ENCOUNTER — Other Ambulatory Visit (HOSPITAL_COMMUNITY): Payer: 59

## 2020-01-12 ENCOUNTER — Ambulatory Visit (HOSPITAL_COMMUNITY): Payer: 59

## 2020-01-13 NOTE — Psych (Signed)
   [facility] PHP THERAPIST PROGRESS NOTE  Daley Gosse [phone]  Pt will be discharged from [facility] due to multiple no shows with no contact. Pt has not responded to case manager's attempts to contact or set up follow-up care.   Diagnosis: Severe episode of recurrent major depressive disorder, without psychotic features (HCC) [F33.2]    1. Severe episode of recurrent major depressive disorder, without psychotic features (HCC)   2. Generalized anxiety disorder       Donia Guiles, [place] 01/13/2020

## 2020-01-19 ENCOUNTER — Ambulatory Visit (HOSPITAL_COMMUNITY): Payer: 59 | Admitting: Psychiatry

## 2020-01-19 ENCOUNTER — Other Ambulatory Visit: Payer: Self-pay

## 2021-07-20 DIAGNOSIS — S93141D Subluxation of metatarsophalangeal joint of right great toe, subsequent encounter: Secondary | ICD-10-CM | POA: Diagnosis not present

## 2021-07-20 DIAGNOSIS — W228XXA Striking against or struck by other objects, initial encounter: Secondary | ICD-10-CM | POA: Diagnosis not present

## 2021-07-20 DIAGNOSIS — S99921A Unspecified injury of right foot, initial encounter: Secondary | ICD-10-CM | POA: Diagnosis not present

## 2021-07-20 DIAGNOSIS — S93111A Dislocation of interphalangeal joint of right great toe, initial encounter: Secondary | ICD-10-CM | POA: Diagnosis not present

## 2021-07-20 DIAGNOSIS — M79671 Pain in right foot: Secondary | ICD-10-CM | POA: Diagnosis not present

## 2021-07-20 DIAGNOSIS — S93141A Subluxation of metatarsophalangeal joint of right great toe, initial encounter: Secondary | ICD-10-CM | POA: Diagnosis not present

## 2022-07-02 DIAGNOSIS — L089 Local infection of the skin and subcutaneous tissue, unspecified: Secondary | ICD-10-CM | POA: Diagnosis not present

## 2022-10-14 ENCOUNTER — Ambulatory Visit: Payer: BC Managed Care – PPO | Admitting: Behavioral Health

## 2022-11-19 ENCOUNTER — Ambulatory Visit (INDEPENDENT_AMBULATORY_CARE_PROVIDER_SITE_OTHER): Payer: BC Managed Care – PPO | Admitting: Behavioral Health

## 2022-11-19 DIAGNOSIS — F411 Generalized anxiety disorder: Secondary | ICD-10-CM | POA: Diagnosis not present

## 2022-11-19 DIAGNOSIS — F331 Major depressive disorder, recurrent, moderate: Secondary | ICD-10-CM | POA: Diagnosis not present

## 2022-11-19 NOTE — Progress Notes (Addendum)
[place] Counselor Initial Adult Exam  Name: Frederick Armstrong Date: 11/20/2022 MRN: [id number] DOB: 01-May-1996 PCP: Patient, No Pcp Per  Time spent: 62 min In Person @ [facility]:  Self    Paperwork requested: No   Reason for Visit /Presenting Problem: Elevated anx/dep & some stressors w/life situation  Mental Status Exam: Appearance:   Casual     Behavior:  Appropriate and Sharing  Motor:  Normal  Speech/Language:   Clear and Coherent and Normal Rate  Affect:  Appropriate  Mood:  normal  Thought process:  normal  Thought content:    WNL  Sensory/Perceptual disturbances:    WNL  Orientation:  oriented to person, place, and time/date  Attention:  Good  Concentration:  Good  Memory:  WNL  Fund of knowledge:   Good  Insight:    Good  Judgment:   Good  Impulse Control:  Good    Risk Assessment: Danger to Self:  No Self-injurious Behavior: No Danger to Others: No Duty to Warn:no Physical Aggression / Violence:No  Access to Firearms a concern: No  Gang Involvement:No  Patient / guardian was educated about steps to take if suicide or homicide risk level increases between visits: yes While future psychiatric events cannot be accurately predicted, the patient does not currently require acute inpatient psychiatric care and does not currently meet [place] involuntary commitment criteria.  Substance Abuse History: Current substance abuse: No     Past Psychiatric History:   Previous psychological history is significant for depression Outpatient Providers:Pt is estb'g w/Dr. Esther Armstrong today for PCP History of Psych Hospitalization: Yes Pt sts he went to [facility] for one day stay w/no overnight & was prescribed medications. He did not take them & is not currently on any psychopharmacologicals. Psychological Testing:  NA    Abuse History:  Victim of: No.,  NA    Report needed: No. Victim of Neglect:No. Perpetrator of  NA   Witness /  Exposure to Domestic Violence: No   Protective Services Involvement: No  Witness to [employer] Violence:  No   Family History:  Family History  Problem Relation Age of Onset   Cervical cancer Maternal Grandmother    Hypertension Maternal Grandmother    Hypertension Maternal Grandfather    Heart disease Neg Hx    Hyperlipidemia Neg Hx     Living situation: the patient lives with their family; Frederick Armstrong who is 28yo  Sexual Orientation: Straight  Relationship Status: single  Name of spouse / other: Sig past Hx w/GF Frederick Armstrong If a parent, number of children / ages:NA  Support Systems: friends Mother Frederick Armstrong who lives in [place], [place] in [place] who is [place]:  No   Income/Employment/Disability: Employment as a Development worker, community for [employer] Service: No   Educational History: Education: some college @ [employer] where he played Football  Religion/Sprituality/World View: Unk  Any cultural differences that may affect / interfere with treatment:  None noted today  Recreation/Hobbies: Watching TV; movies, [name]  Stressors: Loss of Sig Other; GF Frederick Armstrong who is "not good for me, esp'l when we cycle into & out of intimacy"    Strengths: Supportive Relationships, Family, Friends, Hopefulness, Self Advocate, and Able to Communicate Effectively,great sense of humor & wanting to be his best self  Barriers:  None noted today   Legal History: Pending legal issue / charges: The  patient has been involved with the police as a result of small amt of THC in his possession when pulled by Police in [id number] was a highly stressful time from [place] stay in [place] to completion of PO requirements & the dismissal of his case when Theresia Majors was issued due to non-presence for Hearing. History of legal issue / charges: Drug Charges which were very minimal in nature  Medical History/Surgical History: reviewed Past  Medical History:  Diagnosis Date   Childhood asthma     No past surgical history on file.  Medications: Current Outpatient Medications  Medication Sig Dispense Refill   hydrOXYzine (ATARAX/VISTARIL) 10 MG tablet Take 1 tablet (10 mg total) by mouth 3 (three) times daily as needed. 30 tablet 0   sertraline (ZOLOFT) 25 MG tablet Take 1 tablet (25 mg total) by mouth daily. 30 tablet 0   traZODone (DESYREL) 50 MG tablet Take 1 tablet (50 mg total) by mouth at bedtime. 30 tablet 0   No current facility-administered medications for this visit.    No Known Allergies  Diagnoses:  Major depressive disorder, recurrent episode, moderate (HCC)  Generalized anxiety disorder  Plan of Care: Frederick Armstrong does not currently have a PCP. Assisted Pt to estb w/Dr. Esther Hardy, MD today. His appt is 12/07/2022 @ 11:30am.   Frederick Armstrong will attend all sessions as scheduled every 2-3 wks.  Target Date: 02/17/2023  Progress: 4  Frequency: Twice monthly  Modality: Frederick Armstrong wants to be/realize his best self. He wants to know himself so he can be a full participant in a healthy relationship. He will keep a Notebook btwn sessions to record events, thoughts & feelings he needs to process in Psychotherapy.  Target Date: 02/17/2023  Progress: 4  Frequency: Twice monthly  Modality: Frederick Reaper, LMFT

## 2022-11-19 NOTE — Progress Notes (Signed)
                Redmond Whittley L Labrina Lines, LMFT 

## 2022-12-02 ENCOUNTER — Ambulatory Visit: Payer: BC Managed Care – PPO | Admitting: Behavioral Health

## 2022-12-02 NOTE — Progress Notes (Unsigned)
                Jaqueline Uber L Jaylenn Baiza, LMFT 

## 2022-12-07 ENCOUNTER — Ambulatory Visit (INDEPENDENT_AMBULATORY_CARE_PROVIDER_SITE_OTHER): Payer: BC Managed Care – PPO | Admitting: Behavioral Health

## 2022-12-07 ENCOUNTER — Encounter: Payer: Self-pay | Admitting: Family Medicine

## 2022-12-07 ENCOUNTER — Ambulatory Visit: Payer: BC Managed Care – PPO | Admitting: Family Medicine

## 2022-12-07 VITALS — BP 120/60 | HR 83 | Temp 99.1°F | Ht 69.0 in | Wt 144.0 lb

## 2022-12-07 DIAGNOSIS — F331 Major depressive disorder, recurrent, moderate: Secondary | ICD-10-CM | POA: Diagnosis not present

## 2022-12-07 DIAGNOSIS — Z1159 Encounter for screening for other viral diseases: Secondary | ICD-10-CM | POA: Diagnosis not present

## 2022-12-07 DIAGNOSIS — Z0000 Encounter for general adult medical examination without abnormal findings: Secondary | ICD-10-CM

## 2022-12-07 DIAGNOSIS — Z114 Encounter for screening for human immunodeficiency virus [HIV]: Secondary | ICD-10-CM | POA: Diagnosis not present

## 2022-12-07 DIAGNOSIS — F411 Generalized anxiety disorder: Secondary | ICD-10-CM | POA: Diagnosis not present

## 2022-12-07 LAB — COMPREHENSIVE METABOLIC PANEL
ALT: 13 U/L (ref 0–53)
AST: 15 U/L (ref 0–37)
Albumin: 4.2 g/dL (ref 3.5–5.2)
Alkaline Phosphatase: 55 U/L (ref 39–117)
BUN: 12 mg/dL (ref 6–23)
CO2: 23 mEq/L (ref 19–32)
Calcium: 9.2 mg/dL (ref 8.4–10.5)
Chloride: 108 mEq/L (ref 96–112)
Creatinine, Ser: 1.11 mg/dL (ref 0.40–1.50)
GFR: 90.81 mL/min (ref >60.00)
Glucose, Bld: 99 mg/dL (ref 70–99)
Potassium: 3.9 mEq/L (ref 3.5–5.1)
Sodium: 139 mEq/L (ref 135–145)
Total Bilirubin: 0.6 mg/dL (ref 0.2–1.2)
Total Protein: 6.5 g/dL (ref 6.0–8.3)

## 2022-12-07 LAB — CBC WITH DIFFERENTIAL/PLATELET
Basophils Absolute: 0 10*3/uL (ref 0.0–0.1)
Basophils Relative: 0.3 % (ref 0.0–3.0)
Eosinophils Absolute: 0.1 10*3/uL (ref 0.0–0.7)
Eosinophils Relative: 1.6 % (ref 0.0–5.0)
HCT: 43.3 % (ref 39.0–52.0)
Hemoglobin: 14.6 g/dL (ref 13.0–17.0)
Lymphocytes Relative: 48 % — ABNORMAL HIGH (ref 12.0–46.0)
Lymphs Abs: 1.8 10*3/uL (ref 0.7–4.0)
MCHC: 33.8 g/dL (ref 30.0–36.0)
MCV: 92 fl (ref 78.0–100.0)
Monocytes Absolute: 0.3 10*3/uL (ref 0.1–1.0)
Monocytes Relative: 9.1 % (ref 3.0–12.0)
Neutro Abs: 1.5 10*3/uL (ref 1.4–7.7)
Neutrophils Relative %: 41 % — ABNORMAL LOW (ref 43.0–77.0)
Platelets: 168 10*3/uL (ref 150.0–400.0)
RBC: 4.71 Mil/uL (ref 4.22–5.81)
RDW: 13.7 % (ref 11.5–15.5)
WBC: 3.7 10*3/uL — ABNORMAL LOW (ref 4.0–10.5)

## 2022-12-07 LAB — TSH: TSH: 0.54 u[IU]/mL (ref 0.35–5.50)

## 2022-12-07 LAB — HEMOGLOBIN A1C: Hgb A1c MFr Bld: 5.8 % (ref 4.6–6.5)

## 2022-12-07 LAB — LIPID PANEL
Cholesterol: 153 mg/dL (ref 0–200)
HDL: 49.7 mg/dL (ref >39.00)
LDL Cholesterol: 82 mg/dL (ref 0–99)
NonHDL: 103.78
Total CHOL/HDL Ratio: 3
Triglycerides: 111 mg/dL (ref 0.0–149.0)
VLDL: 22.2 mg/dL (ref 0.0–40.0)

## 2022-12-07 NOTE — Progress Notes (Signed)
Phone: [phone]   Subjective:  Patient 27 y.o. male presenting for annual physical.  Chief Complaint  Patient presents with   Establish Care    Initial visit to establish care with new pcp Not fasting    Annual Depression/anxiety - in counseling since end of Feb  mom concerned and prompted pt to come.  Uncle passed from Leukemia recently.  No doc long time.   MJ daily.  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  See problem oriented charting- ROS- ROS: Gen: no fever, chills  Skin: no rash, itching ENT: no ear pain, ear drainage, nasal congestion, rhinorrhea, sinus pressure, sore throat Eyes: no blurry vision, double vision Resp: no cough, wheeze,SOB CV: no CP, palpitations, LE edema,  GI: no heartburn, n/v/d/c, abd pain GU: no dysuria, urgency, frequency, hematuria MSK: no joint pain, myalgias, back pain Neuro: no dizziness, headache, weakness, vertigo Psych: HPI.  No SI  The following were reviewed and entered/updated in epic: Past Medical History:  Diagnosis Date   Allergy    Anxiety    Childhood asthma    Depression    Patient Active Problem List   Diagnosis Date Noted   Major depressive disorder, recurrent episode, moderate ([place]) 12/19/2019   Generalized anxiety disorder 12/19/2019   Major depressive disorder, recurrent episode, severe ([place]) 12/19/2019   Intercostal pain 10/22/2014   Pain in the chest 10/22/2014   History reviewed. No pertinent surgical history.  Family History  Problem Relation Age of Onset   Cervical cancer Maternal Grandmother    Hypertension Maternal Grandmother    Hypertension Maternal Grandfather    Heart disease Neg Hx    Hyperlipidemia Neg Hx     Medications- reviewed and updated No current outpatient medications on file.   No current facility-administered medications for this visit.    Allergies-reviewed and updated No Known Allergies  Social History   Social History Narrative   Not on file   Objective  Objective:  BP  120/60   Pulse 83   Temp 99.1 F (37.3 C) (Temporal)   Ht 5\' 9"  (1.753 m)   Wt 144 lb (65.3 kg)   SpO2 97%   BMI 21.27 kg/m  Physical Exam  Gen: WDWN NAD muscular AAM HEENT: NCAT, conjunctiva not injected, sclera nonicteric TM WNL B, OP moist, no exudates  NECK:  supple, no thyromegaly, no nodes, no carotid bruits CARDIAC: RRR, S1S2+, no murmur. DP 2+B LUNGS: CTAB. No wheezes ABDOMEN:  BS+, soft, NTND, No HSM, no masses EXT:  no edema MSK: no gross abnormalities. MS 5/5 all 4 NEURO: A&O x3.  CN II-XII intact.  PSYCH: normal mood. Good eye contact     Assessment and Plan   Health Maintenance counseling: 1. Anticipatory guidance: Patient counseled regarding regular dental exams q6 months, eye exams yearly, avoiding smoking and second hand smoke, limiting alcohol to 2 beverages per day.   2. Risk factor reduction:  Advised patient of need for regular exercise and diet rich in fruits and vegetables to reduce risk of heart attack and stroke. Exercise- encouraged.   Wt Readings from Last 3 Encounters:  12/07/22 144 lb (65.3 kg)  11/21/14 151 lb (68.5 kg) (43 %, Z= -0.17)*  11/01/14 154 lb 9.6 oz (70.1 kg) (50 %, Z= -0.01)*   * Growth percentiles are based on CDC (Boys, 2-20 Years) data.   3. Immunizations/screenings/ancillary studies Immunization History  Administered Date(s) Administered   PFIZER(Purple Top)SARS-COV-2 Vaccination 05/01/2020   Tdap 06/26/2017   Health Maintenance Due  Topic Date Due   Hepatitis C Screening  Never done    4. Skin cancer screening- advised regular sunscreen use. Denies worrisome, changing, or new skin lesions.  5. Smoking associated screening: non smoker  6. STD screening - ordered   Problem List Items Addressed This Visit   None Visit Diagnoses     Wellness examination    -  Primary   Relevant Orders   Comprehensive metabolic panel   Hemoglobin A1c   Hepatitis C antibody   Lipid panel   TSH   CBC with Differential/Platelet   HIV  Antibody (routine testing w rflx)   Urine cytology ancillary only([facility])   Screening for HIV without presence of risk factors       Relevant Orders   HIV Antibody (routine testing w rflx)   Encounter for hepatitis C screening test for low risk patient       Relevant Orders   Hepatitis C antibody      Wellness-anticipatory guidance.  Work on [employer].  Check CBC,CMP,lipids,TSH, A1C.  F/u 1 yr   Recommended follow up: annual No follow-ups on file. No future appointments.   Lab/Order associations:bagel and butter 1 hr ago-no fasting   ICD-10-CM   1. Wellness examination  Z00.00 Comprehensive metabolic panel    Hemoglobin A1c    Hepatitis C antibody    Lipid panel    TSH    CBC with Differential/Platelet    HIV Antibody (routine testing w rflx)    Urine cytology ancillary only([facility])    2. Screening for HIV without presence of risk factors  Z11.4 HIV Antibody (routine testing w rflx)    3. Encounter for hepatitis C screening test for low risk patient  Z11.59 Hepatitis C antibody      No orders of the defined types were placed in this encounter.    Wellington Hampshire, MD

## 2022-12-07 NOTE — Progress Notes (Signed)
Frederick Armstrong Counselor/Therapist Progress Note  Patient ID: Frederick Armstrong, MRN: [id number],    Date: 12/07/2022  Time Spent: 53 min Caregility video; Pt is home in private & Provider is working remotely from [employer]   Treatment Type: Individual Therapy  Reported Symptoms: Elevated anx/dep & stress levels  Mental Status Exam: Appearance:  Casual     Behavior: Appropriate and Sharing  Motor: Normal  Speech/Language:  Clear and Coherent  Affect: Appropriate  Mood: normal  Thought process: normal  Thought content:   WNL  Sensory/Perceptual disturbances:   WNL  Orientation: oriented to person, place, and time/date  Attention: Good  Concentration: Good  Memory: WNL  Fund of knowledge:  Good  Insight:   Good  Judgment:  Good  Impulse Control: Good   Risk Assessment: Danger to Self:  No Self-injurious Behavior: No Danger to Others: No Duty to Warn:no Physical Aggression / Violence:No  Access to Firearms a concern: No  Gang Involvement:No   Subjective: Pt is excited for his plans to travel to [place]. He is planning this for next year. He needs to save money for this trip.  Pt is also worried for work & how the changes will impact him.   Interventions: Insight-Oriented  Diagnosis:Major depressive disorder, recurrent episode, moderate (HCC)  Generalized anxiety disorder  Plan: Frederick Armstrong is being prudent w/his work situation & making decisions to have this come to fruition. He is making sound decisions to have travel time to [place] in 2025. He will cont to save asap. He will note this in his Notebook to make it reality.  Target Date: 01/07/2023  Progress: 5  Frequency: Twice monthly  Modality: Boykin Reaper, LMFT

## 2022-12-07 NOTE — Progress Notes (Signed)
                Frederick Armstrong L Bennett Vanscyoc, LMFT 

## 2022-12-07 NOTE — Patient Instructions (Signed)
Welcome to [facility] at [facility]! It was a pleasure meeting you today. ° °As discussed, Please schedule a 12 month follow up visit today. ° °PLEASE NOTE: ° °If you had any LAB tests please let us know if you have not heard back within a few days. You may see your results on MyChart before we have a chance to review them but we will give you a call once they are reviewed by us. If we ordered any REFERRALS today, please let us know if you have not heard from their office within the next week.  °Let us know through MyChart if you are needing REFILLS, or have your pharmacy send us the request. You can also use MyChart to communicate with me or any office staff. ° °Please try these tips to maintain a healthy lifestyle: ° °Eat most of your calories during the day when you are active. Eliminate processed foods including packaged sweets (pies, cakes, cookies), reduce intake of potatoes, white bread, white pasta, and white rice. Look for whole grain options, oat flour or almond flour. ° °Each meal should contain half fruits/vegetables, one quarter protein, and one quarter carbs (no bigger than a computer mouse). ° °Cut down on sweet beverages. This includes juice, soda, and sweet tea. Also watch fruit intake, though this is a healthier sweet option, it still contains natural sugar! Limit to 3 servings daily. ° °Drink at least 1 glass of water with each meal and aim for at least 8 glasses per day ° °Exercise at least 150 minutes every week.   °

## 2022-12-08 LAB — HIV ANTIBODY (ROUTINE TESTING W REFLEX): HIV 1&2 Ab, 4th Generation: NONREACTIVE

## 2022-12-08 LAB — HEPATITIS C ANTIBODY: Hepatitis C Ab: NONREACTIVE

## 2022-12-09 ENCOUNTER — Other Ambulatory Visit: Payer: Self-pay | Admitting: *Deleted

## 2022-12-09 DIAGNOSIS — D729 Disorder of white blood cells, unspecified: Secondary | ICD-10-CM

## 2022-12-09 NOTE — Progress Notes (Signed)
Labs look great except: 1.  The urine got missed-either he can return to do that, or skip it if no concerns.  (So GC chlamydia were not checked) 2.A1C(3 month average of sugars) is elevated.  This is considered PreDiabetes.  Work on diet-decrease sugars and starches and aim for 30 minutes of exercise 5 days/week to prevent progression to diabetes  3.  WBCs were a little low-this is probably more likely his normal.  It does not alarm me.  However, need to repeat CBC differential 1 to 2 months to establish his normal

## 2022-12-28 ENCOUNTER — Other Ambulatory Visit (INDEPENDENT_AMBULATORY_CARE_PROVIDER_SITE_OTHER): Payer: BC Managed Care – PPO

## 2022-12-28 ENCOUNTER — Ambulatory Visit: Payer: BC Managed Care – PPO | Admitting: Behavioral Health

## 2022-12-28 DIAGNOSIS — F331 Major depressive disorder, recurrent, moderate: Secondary | ICD-10-CM

## 2022-12-28 DIAGNOSIS — F411 Generalized anxiety disorder: Secondary | ICD-10-CM

## 2022-12-28 DIAGNOSIS — D729 Disorder of white blood cells, unspecified: Secondary | ICD-10-CM | POA: Diagnosis not present

## 2022-12-28 LAB — CBC WITH DIFFERENTIAL/PLATELET
Basophils Absolute: 0 10*3/uL (ref 0.0–0.1)
Basophils Relative: 0.4 % (ref 0.0–3.0)
Eosinophils Absolute: 0.1 10*3/uL (ref 0.0–0.7)
Eosinophils Relative: 2.1 % (ref 0.0–5.0)
HCT: 44.2 % (ref 39.0–52.0)
Hemoglobin: 15 g/dL (ref 13.0–17.0)
Lymphocytes Relative: 45.5 % (ref 12.0–46.0)
Lymphs Abs: 2.2 10*3/uL (ref 0.7–4.0)
MCHC: 33.9 g/dL (ref 30.0–36.0)
MCV: 92.9 fl (ref 78.0–100.0)
Monocytes Absolute: 0.4 10*3/uL (ref 0.1–1.0)
Monocytes Relative: 9.3 % (ref 3.0–12.0)
Neutro Abs: 2 10*3/uL (ref 1.4–7.7)
Neutrophils Relative %: 42.7 % — ABNORMAL LOW (ref 43.0–77.0)
Platelets: 201 10*3/uL (ref 150.0–400.0)
RBC: 4.75 Mil/uL (ref 4.22–5.81)
RDW: 14.2 % (ref 11.5–15.5)
WBC: 4.8 10*3/uL (ref 4.0–10.5)

## 2022-12-28 NOTE — Progress Notes (Signed)
[facility] Counselor/Therapist Progress Note  Patient ID: Frederick Armstrong, MRN: [id number],    Date: 12/28/2022  Time Spent: 55 min In Person @ [facility] [place] [facility]   Treatment Type: Individual Therapy  Reported Symptoms: Elevated anx/dep & stress due to workload inc & responsibilities since 49yr Employee left the job setting.   Mental Status Exam: Appearance:  Casual     Behavior: Appropriate and Sharing  Motor: Normal  Speech/Language:  Clear and Coherent and Normal Rate  Affect: Appropriate  Mood: normal  Thought process: normal  Thought content:   WNL  Sensory/Perceptual disturbances:   WNL  Orientation: oriented to person, place, and time/date  Attention: Good  Concentration: Good  Memory: WNL  Fund of knowledge:  Good  Insight:   Good  Judgment:  Good  Impulse Control: Good   Risk Assessment: Danger to Self:  No Self-injurious Behavior: No Danger to Others: No Duty to Warn:no Physical Aggression / Violence:No  Access to Firearms a concern: No  Gang Involvement:No   Subjective: Pt is stressed due to his added responsibilities @ work. He gets overwhelmed & delegating tasks is difficult for him. He is debating getting a new job or [employer]. He is deeply interested in [place] & wants to travel.   Interventions: Ego-Supportive and Insight-Oriented  Diagnosis:Generalized anxiety disorder  Major depressive disorder, recurrent episode, moderate  Plan: Frederick Armstrong will explore his job options further & mention to his Frederick Armstrong about his hopes of travelling to [place] for [employer]. He is learning [place] for future purposes. Frederick Armstrong will research the [facility] & see if he can discover some interesting things to share w/his Boss to show his interest.  Target Date: 01/19/2023  Progress: 5  Frequency: Every third week  Modality: Frederick Armstrong will have a crucial conversation w/his longtime friend Frederick Armstrong. He has known her since [employer] & senses over the past 6 mos her interest  in him more than friendship.  Target Date: 01/19/2023  Progress: 0  Frequency: Every third week  Modality:Indiv Frederick Armstrong will request a Dermatology Referral from Dr. Ruthine Dose to address his concerns today about his hair health.  Frederick Lever, LMFT

## 2022-12-28 NOTE — Progress Notes (Signed)
                Jahziah Simonin L Jachob Mcclean, LMFT 

## 2023-01-20 ENCOUNTER — Ambulatory Visit: Payer: BC Managed Care – PPO | Admitting: Behavioral Health

## 2023-01-20 DIAGNOSIS — F411 Generalized anxiety disorder: Secondary | ICD-10-CM

## 2023-01-20 DIAGNOSIS — F331 Major depressive disorder, recurrent, moderate: Secondary | ICD-10-CM

## 2023-01-20 NOTE — Progress Notes (Signed)
                Maliek Schellhorn L Ephrem Carrick, LMFT 

## 2023-01-20 NOTE — Progress Notes (Signed)
[facility] Counselor/Therapist Progress Note  Patient ID: Frederick Armstrong, MRN: [id number],    Date: 01/20/2023  Time Spent: 60 min In Person @ [facility] Office   Treatment Type: Individual Therapy  Reported Symptoms: Inc'd stress due to recent trip to [place] to see good friend Warden/ranger  Mental Status Exam: Appearance:  Casual     Behavior: Appropriate and Sharing  Motor: Normal  Speech/Language:  Clear and Coherent  Affect: Appropriate  Mood: normal  Thought process: normal  Thought content:   WNL  Sensory/Perceptual disturbances:   WNL  Orientation: oriented to person, place, and time/date  Attention: Good  Concentration: Good  Memory: WNL  Fund of knowledge:  Good  Insight:   Good  Judgment:  Good  Impulse Control: Good   Risk Assessment: Danger to Self:  No Self-injurious Behavior: No Danger to Others: No Duty to Warn:no Physical Aggression / Violence:No  Access to Firearms a concern: No  Gang Involvement:No   Subjective: Pt is in some distress today due to a situation w/his good friend of 10 yrs. He is confused about the interaction & wants to work through his options today.   Interventions:  Crucial Conversation instructions, neutrality & negotiation  Diagnosis:Generalized anxiety disorder  Major depressive disorder, recurrent episode, moderate (HCC)  Plan: Ryett will use the tips & suggestions offered today to open a conversation w/his good friend. This communication will serve to relieve his distress.  Target Date: 02/18/2023  Progress: 0  Frequency: Once every 3-4 wks  Modality: [name], LMFT

## 2023-02-09 ENCOUNTER — Ambulatory Visit: Payer: BC Managed Care – PPO | Admitting: Behavioral Health

## 2023-03-15 ENCOUNTER — Ambulatory Visit: Payer: BC Managed Care – PPO | Admitting: Behavioral Health

## 2023-03-15 DIAGNOSIS — F411 Generalized anxiety disorder: Secondary | ICD-10-CM | POA: Diagnosis not present

## 2023-03-15 DIAGNOSIS — F331 Major depressive disorder, recurrent, moderate: Secondary | ICD-10-CM | POA: Diagnosis not present

## 2023-03-15 NOTE — Progress Notes (Unsigned)
                Devere Brem L Yanil Dawe, LMFT 

## 2023-03-16 NOTE — Progress Notes (Signed)
[facility] Counselor/Therapist Progress Note  Patient ID: Frederick Armstrong, MRN: [id number],    Date: 03/16/2023  Time Spent: 10:00am-10:55am - 55 min In person @ [facility] Office   Treatment Type: Individual Therapy  Reported Symptoms: Elevated anx/dep & stress due to work issues that are creating a lowered sense of esteem for Pt as he struggles to understand the work culture in a different way.  Mental Status Exam: Appearance:  Casual     Behavior: Appropriate, Sharing, and Motivated  Motor: Normal  Speech/Language:  Clear and Coherent  Affect: Appropriate  Mood: anxious  Thought process: normal  Thought content:   WNL  Sensory/Perceptual disturbances:   WNL  Orientation: oriented to person, place, time/date, and situation  Attention: Good  Concentration: Good  Memory: WNL  Fund of knowledge:  Good  Insight:   Good  Judgment:  Good  Impulse Control: Good   Risk Assessment: Danger to Self:  No Self-injurious Behavior: No Danger to Others: No Duty to Warn:no Physical Aggression / Violence:No  Access to Firearms a concern: No  Gang Involvement:No   Subjective: Pt is upbeat & energetic today. His work situation has declines & he struggles to understand his value in the work setting today. He has been treated unfairly & is angry about it, expressing this in a way that is comfortable to him.   Interventions: Solution-Oriented/Positive Psychology, Ego-Supportive, and Insight-Oriented  Diagnosis:Generalized anxiety disorder  Major depressive disorder, recurrent episode, moderate (HCC)  Plan: [name] will initiate w/a Temporary Agency to find new work on Friday of this week. Even if his work situation improves, he will look into the future & do what is best for himself. He will consider giving Notice Letter to work & make it impressive, as did the 2-20 [employer]eth[employer] in his Dept that recently left. He is tired of being treated badly. He will think more intentionally  about what he needs @ this time & take steps to put his decisions into action @ a pace that is comfortable & where he can take steps w/confidence.   Target Date: 04/20/2023  Progress: 3  Frequency: Once every 3-4 wks   Modality: [name], LMFT

## 2023-03-29 ENCOUNTER — Ambulatory Visit: Payer: BC Managed Care – PPO | Admitting: Behavioral Health

## 2023-03-29 DIAGNOSIS — F331 Major depressive disorder, recurrent, moderate: Secondary | ICD-10-CM | POA: Diagnosis not present

## 2023-03-29 DIAGNOSIS — F411 Generalized anxiety disorder: Secondary | ICD-10-CM

## 2023-03-29 NOTE — Progress Notes (Signed)
                Teegan Brandis L Lyndon Chenoweth, LMFT 

## 2023-03-30 NOTE — Progress Notes (Addendum)
[facility] Counselor/Therapist Progress Note  Patient ID: Frederick Armstrong, MRN: [id number],    Date: 03/30/2023  Time Spent: 55 min In Person @ [facility]  Time In: 2:00pm Time Out: 2:55pm  Treatment Type: Individual Therapy  Reported Symptoms: Reduction in anx/dep & stress due to work issues.  Mental Status Exam: Appearance:  Casual     Behavior: Appropriate and Sharing  Motor: Normal  Speech/Language:  Clear and Coherent  Affect: Appropriate  Mood: anxious  Thought process: normal  Thought content:   WNL  Sensory/Perceptual disturbances:   WNL  Orientation: oriented to person, place, time/date, and situation  Attention: Good  Concentration: Good  Memory: WNL  Fund of knowledge:  Good  Insight:   Good  Judgment:  Good  Impulse Control: Good   Risk Assessment: Danger to Self:  No Self-injurious Behavior: No Danger to Others: No Duty to Warn:no Physical Aggression / Violence:No  Access to Firearms a concern: No  Gang Involvement:No   Subjective: Pt is upbeat today & feels the stress @ work has reduced due to his recent disagreement w/the way 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 His Colleague was fired & the 2 wks w/the new COO has been challenging.    Interventions: Insight-Oriented  Diagnosis:Generalized anxiety disorder  Major depressive disorder, recurrent episode, moderate (HCC)  Plan: [name] is exploring his life options & all he wants to accomplish. He is interested in pursuing the [employer] as a sideline until he can get on his fdtn & quit his current job. He is taking things one step @ a time & needs more research. He will take the time to record his research & to ask many questions of Experts.  Target Date: 05/21/2023  Progress: 6  Frequency: Once every 3-4 wks  Modality: [name], LMFT

## 2023-04-12 ENCOUNTER — Ambulatory Visit: Payer: BC Managed Care – PPO | Admitting: Behavioral Health

## 2023-04-19 ENCOUNTER — Ambulatory Visit: Payer: BC Managed Care – PPO | Admitting: Behavioral Health

## 2023-05-10 ENCOUNTER — Ambulatory Visit: Payer: BC Managed Care – PPO | Admitting: Behavioral Health

## 2023-05-10 DIAGNOSIS — F411 Generalized anxiety disorder: Secondary | ICD-10-CM | POA: Diagnosis not present

## 2023-05-10 DIAGNOSIS — F331 Major depressive disorder, recurrent, moderate: Secondary | ICD-10-CM | POA: Diagnosis not present

## 2023-05-10 NOTE — Progress Notes (Signed)
                Victoria L Winstead, LMFT 

## 2023-05-20 NOTE — Progress Notes (Signed)
[facility] Counselor/Therapist Progress Note  Patient ID: Frederick Armstrong, MRN: [id number],    Date: 05/20/2023  Time Spent: 55 min In Person @ [facility] Time In: 3:00pm Time Out: 3:55pm  Treatment Type: Individual Therapy  Reported Symptoms: Dec in anx/dep & stress as his relationship w/his good male friend has advanced in different ways making him more confident.   Mental Status Exam: Appearance:  Casual     Behavior: Appropriate and Sharing  Motor: Normal  Speech/Language:  Clear and Coherent  Affect: Appropriate  Mood: anxious & always upbeat  Thought process: normal  Thought content:   WNL  Sensory/Perceptual disturbances:   WNL  Orientation: oriented to person, place, time/date, and situation  Attention: Good  Concentration: Good  Memory: WNL  Fund of knowledge:  Good  Insight:   Good  Judgment:  Good  Impulse Control: Good   Risk Assessment: Danger to Self:  No Self-injurious Behavior: No Danger to Others: No Duty to Warn:no Physical Aggression / Violence:No  Access to Firearms a concern: No  Gang Involvement:No   Subjective: Pt is upbeat today & smiling his normal countenance. He has taken some time off from work to do things for himself & this is helping him gain perspective on his job. He has harbored resentment towards the COO of his Company & this is not healthy for him. He notices the resentment & this makes him frustrated. He wants a raise & feels his productivity proves this.   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 This has also raised his confidence as he feels capable of obtaining a new job. His current job is somewhat like a family, so leaving this might be harder than he anticipated.   Interventions: Solution-Oriented/Positive Psychology, Ego-Supportive, and Family Systems  Diagnosis:Generalized anxiety disorder  Major depressive disorder, recurrent episode, moderate (HCC)  Plan: Gedalia's male friend  since [employer] has invited him out w/her Parents recently & it was a good exp for him. He is taking the relationship slow & not pushing. This has worked thus far & he is glad to be playing the situation carefully. He will cont to take the stance of caution & not pushing any agenda.  Target Date: 06/21/2023  Progress: 7  Frequency: Once every 2-3 wks  Modality: Emmitt Mannarino will cont his job search. It is positive for him to be reminded he can be paid more & also be happier due to his skills/talents. He will cont to go on Interviews & explore the job availability.  Target Date: 06/21/2023  Progress: 7  Frequency: Once every 2-3 wks  Modality: [name], LMFT

## 2023-06-09 ENCOUNTER — Ambulatory Visit: Payer: BC Managed Care – PPO | Admitting: Behavioral Health

## 2023-06-09 DIAGNOSIS — F411 Generalized anxiety disorder: Secondary | ICD-10-CM | POA: Diagnosis not present

## 2023-06-09 DIAGNOSIS — F331 Major depressive disorder, recurrent, moderate: Secondary | ICD-10-CM

## 2023-06-09 NOTE — Progress Notes (Signed)
[facility] Counselor/Therapist Progress Note  Patient ID: Frederick Armstrong, MRN: [id number],    Date: 06/09/2023  Time Spent: 55 min In Person @ [facility]  Time In: 9:00am Time Out: 9:55am  Treatment Type: Individual Therapy  Reported Symptoms: Pt expresses one week of thoughts about death; these thoughts are curious in nature; no SI/SA invld. Pt is unaware of any prompts for this, but he is discussing in an existential orientation & has put these thoughts into a perspective that is serving him as he moves in his life. His relationship w/his male interest is positive.  Mental Status Exam: Appearance:  Casual     Behavior: Appropriate and Sharing  Motor: Normal  Speech/Language:  Clear and Coherent and Normal Rate  Affect: Appropriate  Mood: normal  Thought process: normal  Thought content:   WNL  Sensory/Perceptual disturbances:   WNL  Orientation: oriented to person, place, time/date, and situation  Attention: Good  Concentration: Good  Memory: WNL  Fund of knowledge:  Good  Insight:   Good  Judgment:  Good  Impulse Control: Good; Pt displays self-monitoring skills that are refined by his humility   Risk Assessment: Danger to Self:  No Self-injurious Behavior: No Danger to Others: No Duty to Warn:no Physical Aggression / Violence:No  Access to Firearms a concern: No  Gang Involvement:No   Subjective: Pt is examining the nature of death today. He has been thinking about it for a week now & is curious to know more & understand for himself the role death plays in the human condition. He believes he has a Soul & wants to explore this more.  Pt has noticed his relationship w/his male friend since Frederick Armstrong has been stronger & they are seeing ea other more often in 2024. He has maintained a positive attitude & is not pushing anything w/her-he barely initiates communication. It has been good thus far & he still wonders where things are leading. He is grateful  for the positive direction.  Pt has a Mother who is 45yo & named Frederick Armstrong. She is a sounding board for him. His GGM Frederick Armstrong is also very supportive.   Interventions: Psycho-education/Bibliotherapy, Insight-Oriented, and Family Systems  Diagnosis:Generalized anxiety disorder  Major depressive disorder, recurrent episode, moderate (HCC)  Death anxiety  Plan: Frederick Armstrong will explore the readings related to NDEs to discover more about the After Life & his own beliefs. Suggested: 'Waking Up In Heaven' by Frederick Armstrong & Frederick Armstrong., 'Light Beyond' by Frederick Gills, MD (seminal text), & 'Proof of Heaven: A Neurosurgeon's Journey Into the Afterlife' by Frederick Sermons, MD.  He will cont to read the Frederick Armstrong Cookbook he borrowed from his Mother for a [employer] Recipe! He will research Frederick Armstrong & the estb'ment of the Frederick Armstrong in [place] Armstrong in 1905 to understand more about her legacy.  Her will research the field of Armstrong & the study of death. The 'Frederick Armstrong' is a good start!  Target Date: 07/06/2023  Progress: 5  Frequency: Once every 2-3 wks  Modality: Frederick Fraise, LMFT

## 2023-06-09 NOTE — Progress Notes (Signed)
                Frederick Armstrong L Farryn Linares, LMFT 

## 2023-07-07 ENCOUNTER — Ambulatory Visit: Payer: BC Managed Care – PPO | Admitting: Behavioral Health

## 2023-07-07 DIAGNOSIS — F411 Generalized anxiety disorder: Secondary | ICD-10-CM

## 2023-07-07 DIAGNOSIS — F331 Major depressive disorder, recurrent, moderate: Secondary | ICD-10-CM | POA: Diagnosis not present

## 2023-07-07 NOTE — Progress Notes (Signed)
                Anastasya Jewell L Farryn Linares, LMFT 

## 2023-07-07 NOTE — Progress Notes (Signed)
[facility] Counselor/Therapist Progress Note  Patient ID: Frederick Armstrong, MRN: [id number],    Date: 07/07/2023  Time Spent: 55 min In Person @ [facility] Time In: 9:00am Time Out:  9:55am  Treatment Type: Individual Therapy  Reported Symptoms: Elevated anx/dep & stress @ work & w/a close friendship w/a Co-Worker who now lives in [place].  Mental Status Exam: Appearance:  Casual     Behavior: Appropriate and Sharing  Motor: Normal  Speech/Language:  Clear and Coherent  Affect: Appropriate  Mood: normal  Thought process: normal  Thought content:   WNL  Sensory/Perceptual disturbances:   WNL  Orientation: oriented to person, place, time/date, and situation  Attention: Good  Concentration: Good  Memory: WNL  Fund of knowledge:  Good  Insight:   Good  Judgment:  Good  Impulse Control: Good   Risk Assessment: Danger to Self:  No Self-injurious Behavior: No Danger to Others: No Duty to Warn:no Physical Aggression / Violence:No  Access to Firearms a concern: No  Gang Involvement:No   Subjective: Pt is upset w/her close friend's lack of communication lately. She is hurt this friendship is distant since she moved to [place]. She tries to be a good friend & finds it hard to understand their behavior. She is trying to learn about herself & other ppl.   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    Interventions: Insight-Oriented  Diagnosis:Major depressive disorder, recurrent episode, moderate (HCC)  Generalized anxiety disorder  Plan: Frederick Armstrong will keep her mind open about friendships & how she functions in them w/others. She has been hurt in the past in friendships & wants to try harder; watching what she says or does. She wants to learn how to be a better friend & how she can improve communication. Pt appreciates her time in session bc her Mother is not good @ these issues.   Target Date: 08/06/2023  Progress: 6  Frequency: Once every 2-3  wks  Modality: Frederick Armstrong is enjoying her new relationship w/Corey. He is treating her well & his words & actions match up consistently. She will work towards trusting him more as her comfort level increases.   Frederick Lever, LMFT

## 2023-07-29 ENCOUNTER — Ambulatory Visit: Payer: BC Managed Care – PPO | Admitting: Behavioral Health

## 2023-07-29 DIAGNOSIS — F331 Major depressive disorder, recurrent, moderate: Secondary | ICD-10-CM

## 2023-07-29 DIAGNOSIS — F411 Generalized anxiety disorder: Secondary | ICD-10-CM

## 2023-07-29 NOTE — Progress Notes (Signed)
                Anastasya Jewell L Farryn Linares, LMFT 

## 2023-07-29 NOTE — Progress Notes (Signed)
[facility] Counselor/Therapist Progress Note  Patient ID: Frederick Armstrong, MRN: [id number],    Date: 07/29/2023  Time Spent: 55 min In Person @ [facility] Time In: 9:00am Time Out: 9:55am   Treatment Type: Individual Therapy  Reported Symptoms: Elevated anx/dep & stress due to life transitions & activities that have been challenging  Mental Status Exam: Appearance:  Casual     Behavior: Appropriate and Sharing  Motor: Normal  Speech/Language:  Clear and Coherent  Affect: Appropriate  Mood: anxious  Thought process: normal  Thought content:   WNL  Sensory/Perceptual disturbances:   WNL  Orientation: oriented to person, place, time/date, and situation  Attention: Good  Concentration: Good  Memory: WNL  Fund of knowledge:  Good  Insight:   Good  Judgment:  Good  Impulse Control: Good   Risk Assessment: Danger to Self:  No Self-injurious Behavior: No Danger to Others: No Duty to Warn:no Physical Aggression / Violence:No  Access to Firearms a concern: No  Gang Involvement:No   Subjective: Pt is upset due to interaction w/his close male friend Frederick Armstrong about their relationship & how they conducted themselves during their interaction on this particular day when Pt was present in [place] @ her home. It was a confusing day & Pt ended up feeling disregarded.   Pt proceeded to have a [employer] & he kept his distance from his male friend. Since that time he has examined his anger rxn to her from this visit. Today, he is looking @ the viability of this relationship he has known since [employer].   Pt is examining his role in finding his best life. He will explore Buddhism.   Interventions: Insight-Oriented  Diagnosis:Major depressive disorder, recurrent episode, moderate (HCC)  Generalized anxiety disorder  Plan: Frederick Armstrong will explore Buddhism & see what insights he exp's in this quest.  Target Date: 08/21/2023  Progress: 6  Frequency: Once every 2-3  wks  Modality: [name], LMFT

## 2023-08-12 ENCOUNTER — Ambulatory Visit: Payer: BC Managed Care – PPO | Admitting: Family Medicine

## 2023-08-17 ENCOUNTER — Ambulatory Visit: Payer: BC Managed Care – PPO | Admitting: Behavioral Health

## 2023-08-17 ENCOUNTER — Encounter: Payer: Self-pay | Admitting: Family Medicine

## 2023-08-17 DIAGNOSIS — F411 Generalized anxiety disorder: Secondary | ICD-10-CM

## 2023-08-17 DIAGNOSIS — F330 Major depressive disorder, recurrent, mild: Secondary | ICD-10-CM

## 2023-08-17 NOTE — Progress Notes (Signed)
[facility] Counselor/Therapist Progress Note  Patient ID: Frederick Armstrong, MRN: [id number],    Date: 08/17/2023  Time Spent: 55 min In Person @[facility]  - [facility] Office  Time In: 9:00am Time Out: 9:55am  Treatment Type: Individual Therapy  Reported Symptoms: Elevated anx/dep & stress due to Mtg @ work last week that has Pt unsettled & pushing himself to explore a new career even sooner.   Mental Status Exam: Appearance:  Casual     Behavior: Appropriate and Sharing  Motor: Normal  Speech/Language:  Clear and Coherent  Affect: Appropriate  Mood: normal  Thought process: normal  Thought content:   WNL  Sensory/Perceptual disturbances:   WNL  Orientation: oriented to person, place, time/date, and situation  Attention: Good  Concentration: Good  Memory: WNL  Fund of knowledge:  Good  Insight:   Good  Judgment:  Good  Impulse Control: Good   Risk Assessment: Danger to Self:  No Self-injurious Behavior: No Danger to Others: No Duty to Warn:no Physical Aggression / Violence:No  Access to Firearms a concern: No  Gang Involvement:No   Subjective: Pt is upset today over a Mtg called @ work last week that promoted Pt feeling worse about his career path @ his current job. 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 He has been frustrated & now he is more angry about the Company's response to his request for help. He is sensing more than ever his need to change his job. After the Mtg last week, Pt vented to his Mother & Gmother who were both supportive.  Pt is concerned for his tendency to overthink. He feels a trip to change his location & have something to look forward to in the future would be helpful . He has worked in his current job for over 5 yrs & now sees he needs something different that inspires him & his awe. He may consider School again, or he may try to stream his [employer] online & see if this results in a fllw'g. 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   Pt revisited his "failure" with football in [place] in [place]. This is still disappointing & he has regrets. Explored what he has learned also through that time. 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   Interventions: Solution-Oriented/Positive Psychology, Psycho-education/Bibliotherapy, and Insight-Oriented  Diagnosis:Generalized anxiety disorder  Mild episode of recurrent major depressive disorder (HCC)  Plan: Over the next few wks, Ryley will remain open to the signals that starting his own business is something he might pursue. He is worried this change will happen before he is ready. As a way to prevent the crippling over-thinking causes, he will be cont to gain the support of his Family & also his close friend Bean. Tiandre will report back on any observations he makes regarding the process discussed today. He will read some of the book shared in session & also report back any gleanings he gains from the book of essays.  Target Date: 09/20/2023  Progress: 6  Frequency: Once every 2-3 wks  Modality: [name], LMFT

## 2023-08-17 NOTE — Progress Notes (Signed)
                Anastasya Jewell L Farryn Linares, LMFT 

## 2023-09-09 ENCOUNTER — Ambulatory Visit: Payer: BC Managed Care – PPO | Admitting: Behavioral Health

## 2023-09-24 ENCOUNTER — Ambulatory Visit: Payer: BC Managed Care – PPO | Admitting: Behavioral Health

## 2023-09-24 DIAGNOSIS — F330 Major depressive disorder, recurrent, mild: Secondary | ICD-10-CM

## 2023-09-24 DIAGNOSIS — F411 Generalized anxiety disorder: Secondary | ICD-10-CM | POA: Diagnosis not present

## 2023-09-24 NOTE — Progress Notes (Signed)
   Deneise Lever, LMFT

## 2023-09-24 NOTE — Progress Notes (Signed)
 [facility] Counselor/Therapist Progress Note  Patient ID: Frederick Armstrong, MRN: [id number],    Date: 09/24/2023  Time Spent: 55 min Caregility video; Pt is home in private & Provider working remotely from [employer]. Pt is aware of the risks/limitations of telehealth & consents to Tx today.  Time In: 9:00am Time Out: 9:55am   Treatment Type: Individual Therapy  Reported Symptoms: Reduction in anx/dep & stress due to improved mindset about work.  Mental Status Exam: Appearance:  Casual     Behavior: Appropriate and Sharing  Motor: Normal  Speech/Language:  Clear and Coherent  Affect: Appropriate  Mood: normal  Thought process: normal  Thought content:   WNL  Sensory/Perceptual disturbances:   WNL  Orientation: oriented to person, place, time/date, and situation  Attention: Good  Concentration: Good  Memory: WNL  Fund of knowledge:  Good  Insight:   Good  Judgment:  Good  Impulse Control: Good   Risk Assessment: Danger to Self:  No Self-injurious Behavior: No Danger to Others: No Duty to Warn:no Physical Aggression / Violence:No  Access to Firearms a concern: No  Gang Involvement:No   Subjective: 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 He is being reasonable & caring for himself. Over the Holidays he took time for himself to bowl & eat alone. It was a healthy exp contrary thoughts he had at the start. He is challenging himself to do things alone. It has been healthy for him.   Pt has been reading a great deal. He is enjoying this.    His good male friend in [place] Armstrong is less of an issue than it was prior to Thanksgiving. He has also placed this relationship in a healthier perspective. Recent knee surgery has prompted a visit & he has been feeling more reasonable about the exchanges they have shared.   Interventions: Insight-Oriented and Interpersonal  Diagnosis:Generalized anxiety disorder  Mild episode of recurrent major depressive disorder (HCC)  Plan:  Frederick Armstrong is doing well today. He has made efforts to shift his mindset in positive ways towards his work, his close male friend & his general life outlook. He is taking good care of himself & his needs. He will cont on this newly revised perspective & take life one step @ a time using his own pace to guide him. We will check-in on Jan 29th to further explore his need for psychotherapy & he is aware he can contact the Office for scheduling needs in the interim if he feels this is indicated.   Target Date: 10/21/2023  Progress: 8  Frequency: Once monthly prn  Modality: Frederick Richerd LITTIE Hollace, LMFT

## 2023-09-24 NOTE — Addendum Note (Signed)
 Addended by: Deneise Lever on: 09/24/2023 09:32 AM   Modules accepted: Level of Service

## 2023-10-20 ENCOUNTER — Ambulatory Visit: Payer: BC Managed Care – PPO | Admitting: Behavioral Health

## 2023-10-20 DIAGNOSIS — F411 Generalized anxiety disorder: Secondary | ICD-10-CM | POA: Diagnosis not present

## 2023-10-20 DIAGNOSIS — F430 Acute stress reaction: Secondary | ICD-10-CM

## 2023-10-20 NOTE — Progress Notes (Signed)
Deneise Lever, LMFT

## 2023-10-20 NOTE — Progress Notes (Signed)
[facility] Counselor/Therapist Progress Note  Patient ID: Frederick Armstrong, MRN: [id number],    Date: 10/20/2023  Time Spent: 55 min In Person @ [facility]  Time In: 9:00am Time Out: 9:55am  Treatment Type: Individual Therapy  Reported Symptoms: Pt cont's to feel well adjusted @ work w/improved attitude on his part. Elevated anx/dep & stress due to relationalship issues this month.  Mental Status Exam: Appearance:  Casual and Neat     Behavior: Appropriate, Sharing, and Disruptive  Motor: Normal  Speech/Language:  Clear and Coherent  Affect: Appropriate  Mood: normal  Thought process: normal  Thought content:   WNL  Sensory/Perceptual disturbances:   WNL  Orientation: oriented to person, place, time/date, and situation  Attention: Good  Concentration: Good  Memory: WNL  Fund of knowledge:  Good  Insight:   Good  Judgment:  Good  Impulse Control: Good   Risk Assessment: Danger to Self:  No Self-injurious Behavior: No Danger to Others: No Duty to Warn:no Physical Aggression / Violence:No  Access to Firearms a concern: No  Gang Involvement:No   Subjective: Pt is concerned for his close friendship of 10 yrs w/male in [place]. He wants to have a conversation w/her about the question she asked earlier in Jan, "Have you every loved me more than a friend?" This has opened up an opportunity for them to speak more intimately about their relationship.    Interventions: Ego-Supportive and Psycho-education/Bibliotherapy  Diagnosis:Generalized anxiety disorder  Stress reaction  Plan: Frederick Armstrong wants to maintain his friendship w/Kaisha. They have known ea other 10+yrs & he values her. He will plan a f23f w/her @ a mid-point location where they can talk in private & he can gain some clarity about this month's events. He has seen her several times & they communicate by text often. He does not want to lose her friendship by getting too serious. He will use this  conversation as an opportunity to get to know her better.  Target Date: 2/30/2025  Progress: 7  Frequency: Once monthly  Modality: Frederick Fraise, LMFT

## 2023-11-09 ENCOUNTER — Ambulatory Visit: Payer: BC Managed Care – PPO | Admitting: Behavioral Health

## 2023-11-09 DIAGNOSIS — F330 Major depressive disorder, recurrent, mild: Secondary | ICD-10-CM | POA: Diagnosis not present

## 2023-11-09 DIAGNOSIS — F411 Generalized anxiety disorder: Secondary | ICD-10-CM

## 2023-11-09 NOTE — Progress Notes (Signed)
   Deneise Lever, LMFT

## 2023-11-09 NOTE — Progress Notes (Signed)
[facility] Counselor/Therapist Progress Note  Patient ID: Earland Reish, MRN: [id number],    Date: 11/09/2023  Time Spent: 55 min In Person @ [facility]  Time In: 9:00am Time Out: 9:55am  Treatment Type: Individual Therapy  Reported Symptoms: Cont'd reduction in anx/dep & stress due to improved work situation & inc'd self awareness about personal needs & character.  Mental Status Exam: Appearance:  Casual     Behavior: Appropriate and Sharing  Motor: Normal  Speech/Language:  Clear and Coherent  Affect: Appropriate  Mood: normal  Thought process: normal  Thought content:   WNL  Sensory/Perceptual disturbances:   WNL  Orientation: oriented to person, place, time/date, and situation  Attention: Good  Concentration: Good  Memory: WNL  Fund of knowledge:  Good  Insight:   Good  Judgment:  Good  Impulse Control: Good   Risk Assessment: Danger to Self:  No Self-injurious Behavior: No Danger to Others: No Duty to Warn:no Physical Aggression / Violence:No  Access to Firearms a concern: No  Gang Involvement:No   Subjective: Pt is upbeat & calm today, feeling positive about work & where his life is currently going. 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   Pt has relaxed off his male friend's new male interest as a topic of his attn. He never approached her about the question she asked in Jan. He does not want to push this relationship away from friendship. On Valentine's Day, his feelings were hurt, but he moved past the lack of mention by his male friend & decided to step back from this relationship & gain some perspective. He will make an effort to be less available in the future.   Pt is trying to generally plan some trips for this year. His goal is to travel to [place], but fears he will not save enough to go. He has plans w/his friend Bean in March. He is using the Planner his Co-Worker  Cookie gave him @ Christmas. He will use this strategically in the future & fill it w/dreams & aspirations. He will write reminders for the Gym & books he wants to read.  Pt's Mother has expressed concern for his lack of a sig relationship. He appreciates her concern, but will not seek phone numbers. Mother called this "side quests".   Pt feels his self-confidence is lacking. He wonders if he is good enough in many areas.   Interventions: Insight-Oriented  Diagnosis:Generalized anxiety disorder  Mild episode of recurrent major depressive disorder (HCC)  Plan: Jayr will use his Planner to write down thoughts that occur to him ea day so he can keep himself on track. 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 He will also consider givng his dreams consideration as he is Single & has no constraints.  Target Date: 12/21/2023  Progress: 7  Frequency: Once monthly  Modality: [name], LMFT

## 2023-12-07 ENCOUNTER — Ambulatory Visit: Payer: BC Managed Care – PPO | Admitting: Behavioral Health

## 2023-12-17 ENCOUNTER — Ambulatory Visit (INDEPENDENT_AMBULATORY_CARE_PROVIDER_SITE_OTHER): Payer: BC Managed Care – PPO | Admitting: Family Medicine

## 2023-12-17 ENCOUNTER — Encounter: Payer: Self-pay | Admitting: Family Medicine

## 2023-12-17 VITALS — BP 132/70 | HR 87 | Temp 97.0°F | Resp 16 | Ht 69.0 in | Wt 141.5 lb

## 2023-12-17 DIAGNOSIS — Z13220 Encounter for screening for lipoid disorders: Secondary | ICD-10-CM | POA: Diagnosis not present

## 2023-12-17 DIAGNOSIS — Z131 Encounter for screening for diabetes mellitus: Secondary | ICD-10-CM | POA: Diagnosis not present

## 2023-12-17 DIAGNOSIS — Z0000 Encounter for general adult medical examination without abnormal findings: Secondary | ICD-10-CM | POA: Diagnosis not present

## 2023-12-17 NOTE — Patient Instructions (Signed)

## 2023-12-17 NOTE — Progress Notes (Signed)
 Phone: [phone]   Subjective:  Patient 28 y.o. male presenting for annual physical.  Chief Complaint  Patient presents with   Annual Exam    CPE Not fasting    Annual Discussed the use of AI scribe software for clinical note transcription with the patient, who gave verbal consent to proceed.  History of Present Illness Frederick Armstrong is a 28 year old male who presents with chest pain.  He has been experiencing sharp chest pain that began approximately one month ago. The pain was sharp and transient, occurring once daily for about a week. Initially, it was localized to the right side of the chest and later moved to the left side, which caused concern. The pain occurred randomly, even while sitting or cooking, and was not associated with physical activity. The episodes ceased spontaneously two to three days after scheduling the appointment, and he has not experienced any further episodes since then. No associated symptoms such as shortness of breath, dizziness, or coughing during the episodes. No recent changes in his exercise routine that could have contributed to the pain.  He mentions a lump on his left thumb that has been present for a couple of months. It is non-painful and does not affect thumb movement. He recalls a possible splinter but is unsure of any specific injury that could have caused the lump.  He has a history of elevated A1c and has made dietary changes in response. His exercise routine is inconsistent, with periods of activity followed by inactivity. He is currently not on any medications.  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 He is not currently in a relationship and has no children. He continues to attend counseling sessions to manage stress and anxiety. No major headaches, dizziness, passing out, blurry or double vision, skin rashes, changing moles, runny nose, congestion, sneezing, chest pains, heart racing, skipping beats, shortness of  breath, dizziness, coughing, muscle aches, joint pains, arthritis, thoughts of suicide, or any issues with erections. He reports managing stress and anxiety through counseling, which he finds beneficial.    See problem oriented charting- ROS- ROS: Gen: no fever, chills  Skin: no rash, itching ENT: no ear pain, ear drainage, nasal congestion, rhinorrhea, sinus pressure, sore throat Eyes: no blurry vision, double vision Resp: no cough, wheeze,SOB CV: no CP, palpitations, LE edema,  GI: no heartburn, n/v/d/c, abd pain GU: no dysuria, urgency, frequency, hematuria MSK: no joint pain, myalgias, back pain Neuro: no dizziness, headache, weakness, vertigo PsychHPI SI   The following were reviewed and entered/updated in epic: Past Medical History:  Diagnosis Date   Allergy    Anxiety    Childhood asthma    Depression    Patient Active Problem List   Diagnosis Date Noted   Major depressive disorder, recurrent episode, moderate (HCC) 12/19/2019   Generalized anxiety disorder 12/19/2019   Major depressive disorder, recurrent episode, severe (HCC) 12/19/2019   Intercostal pain 10/22/2014   Pain in the chest 10/22/2014   History reviewed. No pertinent surgical history.  Family History  Problem Relation Age of Onset   Cervical cancer Maternal Grandmother    Hypertension Maternal Grandmother    Hypertension Maternal Grandfather    Heart disease Neg Hx    Hyperlipidemia Neg Hx     Medications- reviewed and updated No current outpatient medications on file.   No current facility-administered medications for this visit.    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 DP 2+B LUNGS: CTAB. No wheezes ABDOMEN:  BS+, soft, NTND, No HSM, no masses EXT:  no edema MSK: no gross abnormalities. MS 5/5 all 4 NEURO: A&O x3.  CN II-XII intact.  PSYCH: normal mood. Good eye contact   Palmar side L  1st MCP joint line-hyperkeratotic lesion.  Poss FB rxn.    Assessment and Plan   Health Maintenance counseling: 1. Anticipatory guidance: Patient counseled regarding regular dental exams q6 months, eye exams yearly, avoiding smoking and second hand smoke, limiting alcohol to 2 beverages per day.   2. Risk factor reduction:  Advised patient of need for regular exercise and diet rich in fruits and vegetables to reduce risk of heart attack and stroke. Exercise- intermitt.   Wt Readings from Last 3 Encounters:  12/17/23 141 lb 8 oz (64.2 kg)  12/07/22 144 lb (65.3 kg)  11/21/14 151 lb (68.5 kg) (43%, Z= -0.17)*   * Growth percentiles are based on CDC (Boys, 2-20 Years) data.   3. Immunizations/screenings/ancillary studies Immunization History  Administered Date(s) Administered   Dtap, Unspecified 05/18/1995, 07/09/1995, 01/10/1996, 10/09/1997, 05/06/2000   HIB, Unspecified 05/18/1995, 07/09/1995, 01/10/1996, 10/09/1997   HPV Quadrivalent 12/31/2008   Hep B, Unspecified November 05, 1994, 03/15/1995, 09/13/1995   Hepatitis A, Ped/Adol-2 Dose 07/05/2006, 12/31/2008   MMR 10/09/1997, 05/06/2000   Meningococcal Conjugate 07/05/2006   PFIZER(Purple Top)SARS-COV-2 Vaccination 05/01/2020   Polio, Unspecified 05/18/1995, 07/09/1995, 01/10/1996, 10/09/1997, 05/06/2000   Tdap 07/05/2006, 06/26/2017   Varicella 07/05/2006, 12/31/2008   There are no preventive care reminders to display for this patient.  4. Skin cancer screening- Iadvised regular sunscreen use. Denies worrisome, changing, or new skin lesions.  5. Smoking  associated screening: non smoker  6. STD screening - n/a  Wellness examination -     Lipid panel -     Comprehensive metabolic panel with GFR -     CBC with Differential/Platelet -     Hemoglobin A1c -     TSH   Wellness-anticipatory guidance.  Work on Diet/Exercise  Check CBC,CMP,lipids,TSH, A1C.  F/u 1 yr  Assessment and Plan Assessment & Plan Intermittent Chest Pain   He experienced intermittent sharp chest pain once daily for about a week, starting on the right side and moving to the left. The pain resolved spontaneously and has not recurred. It was not associated with shortness of breath, dizziness, or cough. Differential diagnosis includes musculoskeletal pain, stress or anxiety-related pain, and less likely cardiac or gallbladder issues. Cardiac causes are considered less likely due to his age and overall health. Stress and anxiety are potential contributors. Monitor for recurrence and note any associated activities or symptoms. Encourage consistent exercise to help manage anxiety and overall health.  Elevated Hemoglobin A1c   His hemoglobin A1c is elevated, and dietary changes have been implemented. Exercise is inconsistent, with periods of activity followed by inactivity. Consistent exercise is recommended to help manage blood sugar levels and improve overall health. Encourage consistent exercise, aiming for 30  minutes, five days a week, or at least a routine he can maintain.  Left Thumb Lump   A small, non-painful lump on his left thumb has been present for a couple of months. It does not affect thumb movement and may be a wart or a reaction to a foreign body, such as a splinter. The lump does not appear concerning for malignancy or infection. Schedule a follow-up appointment for further evaluation and possible removal if it does not resolve spontaneously. Consider self-care options such as using a sterilized needle to tease out any foreign body.  General Health Maintenance   He is  managing stress and anxiety through counseling and is on no current medications. Advised to maintain a healthy lifestyle to prevent future health issues. Continue counseling to manage stress and anxiety. Encourage a healthy lifestyle, including regular exercise and a balanced diet. Advise against drinking and driving and drug use.    Recommended follow up: Return in about 1 year (around 12/16/2024) for annual physical.  Lab/Order associations:4 hrs fasting  Angelena Sole, MD

## 2023-12-24 ENCOUNTER — Other Ambulatory Visit

## 2023-12-24 LAB — CBC WITH DIFFERENTIAL/PLATELET
Basophils Absolute: 0 10*3/uL (ref 0.0–0.1)
Basophils Relative: 0.4 % (ref 0.0–3.0)
Eosinophils Absolute: 0.1 10*3/uL (ref 0.0–0.7)
Eosinophils Relative: 1.2 % (ref 0.0–5.0)
HCT: 43.9 % (ref 39.0–52.0)
Hemoglobin: 14.9 g/dL (ref 13.0–17.0)
Lymphocytes Relative: 46.8 % — ABNORMAL HIGH (ref 12.0–46.0)
Lymphs Abs: 3 10*3/uL (ref 0.7–4.0)
MCHC: 34 g/dL (ref 30.0–36.0)
MCV: 93.6 fl (ref 78.0–100.0)
Monocytes Absolute: 0.5 10*3/uL (ref 0.1–1.0)
Monocytes Relative: 7.3 % (ref 3.0–12.0)
Neutro Abs: 2.8 10*3/uL (ref 1.4–7.7)
Neutrophils Relative %: 44.3 % (ref 43.0–77.0)
Platelets: 175 10*3/uL (ref 150.0–400.0)
RBC: 4.69 Mil/uL (ref 4.22–5.81)
RDW: 14.1 % (ref 11.5–15.5)
WBC: 6.3 10*3/uL (ref 4.0–10.5)

## 2023-12-24 LAB — COMPREHENSIVE METABOLIC PANEL WITH GFR
ALT: 18 U/L (ref 0–53)
AST: 16 U/L (ref 0–37)
Albumin: 4.6 g/dL (ref 3.5–5.2)
Alkaline Phosphatase: 58 U/L (ref 39–117)
BUN: 19 mg/dL (ref 6–23)
CO2: 26 meq/L (ref 19–32)
Calcium: 9.2 mg/dL (ref 8.4–10.5)
Chloride: 108 meq/L (ref 96–112)
Creatinine, Ser: 1.19 mg/dL (ref 0.40–1.50)
GFR: 82.92 mL/min (ref >60.00)
Glucose, Bld: 88 mg/dL (ref 70–99)
Potassium: 4.1 meq/L (ref 3.5–5.1)
Sodium: 141 meq/L (ref 135–145)
Total Bilirubin: 0.7 mg/dL (ref 0.2–1.2)
Total Protein: 6.6 g/dL (ref 6.0–8.3)

## 2023-12-24 LAB — HEMOGLOBIN A1C: Hgb A1c MFr Bld: 5.7 % (ref 4.6–6.5)

## 2023-12-24 LAB — LIPID PANEL
Cholesterol: 158 mg/dL (ref 0–200)
HDL: 45.3 mg/dL (ref >39.00)
LDL Cholesterol: 94 mg/dL (ref 0–99)
NonHDL: 112.71
Total CHOL/HDL Ratio: 3
Triglycerides: 94 mg/dL (ref 0.0–149.0)
VLDL: 18.8 mg/dL (ref 0.0–40.0)

## 2023-12-24 LAB — TSH: TSH: 1.43 u[IU]/mL (ref 0.35–5.50)

## 2023-12-26 ENCOUNTER — Encounter: Payer: Self-pay | Admitting: Family Medicine

## 2023-12-26 NOTE — Progress Notes (Signed)
 Labs great except: 1.  A1C(3 month average of sugars) is elevated.  This may be considered PreDiabetes.  Work on diet-decrease sugars and starches and aim for 30 minutes of exercise 5 days/week to prevent progression to diabetes

## 2024-01-24 ENCOUNTER — Ambulatory Visit (INDEPENDENT_AMBULATORY_CARE_PROVIDER_SITE_OTHER): Admitting: Behavioral Health

## 2024-01-24 DIAGNOSIS — F411 Generalized anxiety disorder: Secondary | ICD-10-CM | POA: Diagnosis not present

## 2024-01-24 DIAGNOSIS — F330 Major depressive disorder, recurrent, mild: Secondary | ICD-10-CM | POA: Diagnosis not present

## 2024-01-24 NOTE — Progress Notes (Signed)
   Frederick Lever, LMFT

## 2024-01-24 NOTE — Progress Notes (Signed)
 [facility] Counselor/Therapist Progress Note  Patient ID: Frederick Armstrong, MRN: [id number],    Date: 01/24/2024  Time Spent: 50 min Caregility video; Pt is home in private & Provider working remotely from [employer]. Pt is aware of the risks/limitations of telehealth & consents to Tx today. Time In: 9:00am Time Out: 9:50am   Treatment Type: Individual Therapy  Reported Symptoms: Pt is making efforts to do activities alone more often. His comfort level has inc'd. Elevated anx/dep due to life stressors.  Mental Status Exam: Appearance:  Casual     Behavior: Appropriate and Sharing  Motor: Normal  Speech/Language:  Clear and Coherent  Affect: Appropriate  Mood: normal  Thought process: normal  Thought content:   WNL  Sensory/Perceptual disturbances:   WNL  Orientation: oriented to person, place, time/date, and situation  Attention: Good  Concentration: Good  Memory: WNL  Fund of knowledge:  Good  Insight:   Good  Judgment:  Good  Impulse Control: Good   Risk Assessment: Danger to Self:  No Self-injurious Behavior: No Danger to Others: No Duty to Warn:no Physical Aggression / Violence:No  Access to Firearms a concern: No  Gang Involvement:No   Subjective: Pt has created a list of Goals for 2025 that he wants to accomplish. He is being lenient & flexible w/himself on how soon to finish. He wants to enjoy the journey.  Work is going "good". He is encountering some issues.  Interventions: Ego-Supportive and Insight-Oriented  Diagnosis:Generalized anxiety disorder  Mild episode of recurrent major depressive disorder (HCC)  Plan: Fielder is trying to enrich his personal life by doing many interesting & varied activities. He will keep in mind these activities do not need to be rushed. He is keeping his perspective. He will cont this attitude about his personal life. His work is promoting anxiety & he is trying to balance this w/his personal life. He will keep his  focus in perspective.   Target Date: 02/24/2024  Progress: 7  Frequency: Once monthly  Modality: Deborrah Fam, LMFT

## 2024-02-21 ENCOUNTER — Ambulatory Visit: Payer: Self-pay | Admitting: Behavioral Health

## 2024-02-21 DIAGNOSIS — F430 Acute stress reaction: Secondary | ICD-10-CM

## 2024-02-21 DIAGNOSIS — F330 Major depressive disorder, recurrent, mild: Secondary | ICD-10-CM

## 2024-02-21 DIAGNOSIS — F411 Generalized anxiety disorder: Secondary | ICD-10-CM

## 2024-02-21 NOTE — Progress Notes (Signed)
   Deneise Lever, LMFT

## 2024-02-21 NOTE — Progress Notes (Signed)
 [facility] Counselor/Therapist Progress Note  Patient ID: Frederick Armstrong, MRN: [id number],    Date: 02/21/2024  Time Spent: 45 min Caregility video; Pt is home in private & Provider working remotely from [employer]. Pt is aware of the risks/limitations of telehealth & consents to Tx today. Time In: 9:00am Time Out: 9:45am  Treatment Type: Individual Therapy  Reported Symptoms: Elevated anx/dep & stress due to recently being fired from his place of Employment.  Mental Status Exam: Appearance:  Casual     Behavior: Appropriate and Sharing  Motor: Normal  Speech/Language:  Clear and Coherent  Affect: Appropriate  Mood: normal  Thought process: normal  Thought content:   WNL  Sensory/Perceptual disturbances:   WNL  Orientation: oriented to person, place, time/date, and situation  Attention: Good  Concentration: Good  Memory: WNL  Fund of knowledge:  Good  Insight:   Good  Judgment:  Good  Impulse Control: Good   Risk Assessment: Danger to Self:  No Self-injurious Behavior: No Danger to Others: No Duty to Warn:no Physical Aggression / Violence:No  Access to Firearms a concern: No  Gang Involvement:No   Subjective: Pt was fired from [employer] on May 16th. He was initially upset/angry/frustrated. Now, he has turned a corner & doing better. He is moving past the initial upset & his Mother has offered many reassuring & comforting words. He is going for an Interview tomorrow & confident it will go well.  Pt has exp'd time to give thought to his future. He is excited it offers new opportunities.  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 Pt  will schedule himself after he determines when his Ins runs out & how he can be seen again.  Target Date: 03/20/2024  Progress: 7  Frequency: Once monthly; TBD  Modality: Deborrah Fam, LMFT

## 2024-12-18 ENCOUNTER — Encounter: Admitting: Family Medicine
